# Patient Record
Sex: Male | Born: 1980
Health system: Southern US, Community
[De-identification: ages and names within clinical notes are randomized; demographics above are authoritative.]

## PROBLEM LIST (undated history)

## (undated) ENCOUNTER — Emergency Department (HOSPITAL_COMMUNITY): Admission: EM | Payer: Self-pay

## (undated) DIAGNOSIS — S56119A Strain of flexor muscle, fascia and tendon of finger of unspecified finger at forearm level, initial encounter: Secondary | ICD-10-CM

## (undated) HISTORY — PX: KNEE ARTHROSCOPY: SUR90

---

## 2018-09-30 ENCOUNTER — Other Ambulatory Visit: Payer: Self-pay

## 2018-09-30 ENCOUNTER — Ambulatory Visit (INDEPENDENT_AMBULATORY_CARE_PROVIDER_SITE_OTHER): Payer: 59 | Admitting: Emergency Medicine

## 2018-09-30 ENCOUNTER — Encounter: Payer: Self-pay | Admitting: Emergency Medicine

## 2018-09-30 VITALS — BP 106/68 | HR 59 | Temp 97.9°F | Resp 16 | Ht 70.0 in | Wt 264.4 lb

## 2018-09-30 DIAGNOSIS — J22 Unspecified acute lower respiratory infection: Secondary | ICD-10-CM | POA: Diagnosis not present

## 2018-09-30 DIAGNOSIS — R059 Cough, unspecified: Secondary | ICD-10-CM | POA: Insufficient documentation

## 2018-09-30 DIAGNOSIS — R05 Cough: Secondary | ICD-10-CM

## 2018-09-30 MED ORDER — AZITHROMYCIN 250 MG PO TABS: ORAL_TABLET | ORAL | 0 refills | Status: DC

## 2018-09-30 MED ORDER — BENZONATATE 200 MG PO CAPS: 200.0000 mg | ORAL_CAPSULE | Freq: Two times a day (BID) | ORAL | 0 refills | Status: DC | PRN

## 2018-09-30 NOTE — Progress Notes (Signed)
Ronald Hill 38 y.o.   Chief Complaint  Patient presents with  . Cough    productive with yellow mucus x 1 1/2 weeks  . Sore Throat    with left ear pain    HISTORY OF PRESENT ILLNESS: This is a 38 y.o. male complaining of productive cough for 1-1/[redacted] weeks along with sore throat and left ear pain.  Denies recent traveling.  No exposure to coronavirus.  No other significant symptoms.   HPI   Prior to Admission medications   Not on File    Allergies  Allergen Reactions  . Amoxicillin Other (See Comments)    With same symptoms as PCN  . Penicillins Other (See Comments)    Per patient when showered felt like body was burning and navel had pus coming out of it    There are no active problems to display for this patient.   No past medical history on file.    Social History   Socioeconomic History  . Marital status: Married    Spouse name: Not on file  . Number of children: Not on file  . Years of education: Not on file  . Highest education level: Not on file  Occupational History  . Not on file  Social Needs  . Financial resource strain: Not on file  . Food insecurity:    Worry: Not on file    Inability: Not on file  . Transportation needs:    Medical: Not on file    Non-medical: Not on file  Tobacco Use  . Smoking status: Current Every Day Smoker    Years: 14.00    Types: Cigarettes  . Smokeless tobacco: Never Used  Substance and Sexual Activity  . Alcohol use: Not on file    Comment: BEER OR SHOT OF LIQUOR  . Drug use: Yes    Types: Marijuana  . Sexual activity: Not on file  Lifestyle  . Physical activity:    Days per week: Not on file    Minutes per session: Not on file  . Stress: Not on file  Relationships  . Social connections:    Talks on phone: Not on file    Gets together: Not on file    Attends religious service: Not on file    Active member of club or organization: Not on file    Attends meetings of clubs or organizations: Not on file   Relationship status: Not on file  . Intimate partner violence:    Fear of current or ex partner: Not on file    Emotionally abused: Not on file    Physically abused: Not on file    Forced sexual activity: Not on file  Other Topics Concern  . Not on file  Social History Narrative  . Not on file    No family history on file.   Review of Systems  Constitutional: Negative.  Negative for chills and fever.  HENT: Negative.  Negative for sore throat.   Eyes: Negative.   Respiratory: Positive for cough and sputum production.   Cardiovascular: Negative.  Negative for chest pain and palpitations.  Gastrointestinal: Negative.  Negative for abdominal pain, diarrhea, nausea and vomiting.  Genitourinary: Negative.   Musculoskeletal: Negative.   Skin: Negative.  Negative for rash.  Neurological: Negative.  Negative for dizziness and headaches.  Endo/Heme/Allergies: Negative.   All other systems reviewed and are negative.  Vitals:   09/30/18 1601  BP: 106/68  Pulse: (!) 59  Resp: 16  Temp:  97.9 F (36.6 C)  SpO2: 98%     Physical Exam Vitals signs reviewed.  Constitutional:      Appearance: Normal appearance.  HENT:     Head: Normocephalic and atraumatic.     Nose: Nose normal.     Mouth/Throat:     Mouth: Mucous membranes are moist.     Pharynx: Oropharynx is clear.  Eyes:     Extraocular Movements: Extraocular movements intact.     Conjunctiva/sclera: Conjunctivae normal.     Pupils: Pupils are equal, round, and reactive to light.  Neck:     Musculoskeletal: Normal range of motion and neck supple.  Cardiovascular:     Rate and Rhythm: Normal rate and regular rhythm.     Heart sounds: Normal heart sounds.  Pulmonary:     Effort: Pulmonary effort is normal.     Breath sounds: Normal breath sounds.  Abdominal:     Palpations: Abdomen is soft.     Tenderness: There is no abdominal tenderness.  Musculoskeletal: Normal range of motion.  Skin:    General: Skin is warm  and dry.     Capillary Refill: Capillary refill takes less than 2 seconds.  Neurological:     General: No focal deficit present.     Mental Status: He is alert and oriented to person, place, and time.  Psychiatric:        Mood and Affect: Mood normal.        Behavior: Behavior normal.    A total of 30 minutes was spent in the room with the patient, greater than 50% of which was in counseling/coordination of care regarding diagnosis, treatment, medications, need to stay home for 2 weeks, ED precautions, prognosis, and need for follow-up if no better or worse.   ASSESSMENT & PLAN: Ronald Hill was seen today for cough and sore throat.  Diagnoses and all orders for this visit:  Cough -     benzonatate (TESSALON) 200 MG capsule; Take 1 capsule (200 mg total) by mouth 2 (two) times daily as needed for cough.  Lower respiratory infection -     azithromycin (ZITHROMAX) 250 MG tablet; Sig as indicated    Patient Instructions    Out of work note for 2 weeks.   If you have lab work done today you will be contacted with your lab results within the next 2 weeks.  If you have not heard from [place] then please contact us. The fastest way to get your results is to register for My Chart.   IF you received an x-ray today, you will receive an invoice from [facility]. Please contact [facility] at [phone] with questions or concerns regarding your invoice.   IF you received labwork today, you will receive an invoice from [facility]. Please contact [facility] at [phone] with questions or concerns regarding your invoice.   Our billing staff will not be able to assist you with questions regarding bills from these companies.  You will be contacted with the lab results as soon as they are available. The fastest way to get your results is to activate your My Chart account. Instructions are located on the last page of this paperwork. If you have not heard from [place] regarding the results in  2 weeks, please contact this office.     Cough, Adult  A cough helps to clear your throat and lungs. A cough may last only 2-3 weeks (acute), or it may last longer than 8 weeks (chronic). Many different things  can cause a cough. A cough may be a sign of an illness or another medical condition. Follow these instructions at home:  Pay attention to any changes in your cough.  Take medicines only as told by your doctor. ? If you were prescribed an antibiotic medicine, take it as told by your doctor. Do not stop taking it even if you start to feel better. ? Talk with your doctor before you try using a cough medicine.  Drink enough fluid to keep your pee (urine) clear or pale yellow.  If the air is dry, use a cold steam vaporizer or humidifier in your home.  Stay away from things that make you cough at work or at home.  If your cough is worse at night, try using extra pillows to raise your head up higher while you sleep.  Do not smoke, and try not to be around smoke. If you need help quitting, ask your doctor.  Do not have caffeine.  Do not drink alcohol.  Rest as needed. Contact a doctor if:  You have new problems (symptoms).  You cough up yellow fluid (pus).  Your cough does not get better after 2-3 weeks, or your cough gets worse.  Medicine does not help your cough and you are not sleeping well.  You have pain that gets worse or pain that is not helped with medicine.  You have a fever.  You are losing weight and you do not know why.  You have night sweats. Get help right away if:  You cough up blood.  You have trouble breathing.  Your heartbeat is very fast. This information is not intended to replace advice given to you by your health care provider. Make sure you discuss any questions you have with your health care provider. Document Released: 03/09/2011 Document Revised: 12/02/2015 Document Reviewed: 09/02/2014 Elsevier Interactive Patient Education  2019  Elsevier Inc.      Edwina BarthMiguel Jerika Wales, MD [facility]

## 2018-09-30 NOTE — Patient Instructions (Addendum)
  Out of work note for 2 weeks.   If you have lab work done today you will be contacted with your lab results within the next 2 weeks.  If you have not heard from [place] then please contact us. The fastest way to get your results is to register for My Chart.   IF you received an x-ray today, you will receive an invoice from [facility]. Please contact [facility] at [phone] with questions or concerns regarding your invoice.   IF you received labwork today, you will receive an invoice from [facility]. Please contact [facility] at [phone] with questions or concerns regarding your invoice.   Our billing staff will not be able to assist you with questions regarding bills from these companies.  You will be contacted with the lab results as soon as they are available. The fastest way to get your results is to activate your My Chart account. Instructions are located on the last page of this paperwork. If you have not heard from [place] regarding the results in 2 weeks, please contact this office.     Cough, Adult  A cough helps to clear your throat and lungs. A cough may last only 2-3 weeks (acute), or it may last longer than 8 weeks (chronic). Many different things can cause a cough. A cough may be a sign of an illness or another medical condition. Follow these instructions at home:  Pay attention to any changes in your cough.  Take medicines only as told by your doctor. ? If you were prescribed an antibiotic medicine, take it as told by your doctor. Do not stop taking it even if you start to feel better. ? Talk with your doctor before you try using a cough medicine.  Drink enough fluid to keep your pee (urine) clear or pale yellow.  If the air is dry, use a cold steam vaporizer or humidifier in your home.  Stay away from things that make you cough at work or at home.  If your cough is worse at night, try using extra pillows to raise your head up higher while you  sleep.  Do not smoke, and try not to be around smoke. If you need help quitting, ask your doctor.  Do not have caffeine.  Do not drink alcohol.  Rest as needed. Contact a doctor if:  You have new problems (symptoms).  You cough up yellow fluid (pus).  Your cough does not get better after 2-3 weeks, or your cough gets worse.  Medicine does not help your cough and you are not sleeping well.  You have pain that gets worse or pain that is not helped with medicine.  You have a fever.  You are losing weight and you do not know why.  You have night sweats. Get help right away if:  You cough up blood.  You have trouble breathing.  Your heartbeat is very fast. This information is not intended to replace advice given to you by your health care provider. Make sure you discuss any questions you have with your health care provider. Document Released: 03/09/2011 Document Revised: 12/02/2015 Document Reviewed: 09/02/2014 Elsevier Interactive Patient Education  2019 [employer].

## 2018-10-07 ENCOUNTER — Telehealth: Payer: Self-pay | Admitting: Emergency Medicine

## 2018-10-07 NOTE — Telephone Encounter (Signed)
Pt. Calling and asking if he had a corona virus test.states his employer wants to know. Also reports his pharmacy reports Kimberlee Nearing is on back order. Can something else for cough be sent to his pharmacy.

## 2018-10-08 NOTE — Telephone Encounter (Signed)
Called pt and informed him that he did not get tested for Covid-19 and that I would send the provider a message about a different medication for cough.

## 2018-10-09 NOTE — Telephone Encounter (Signed)
Spoke with pt about concerns and advised him of OTC medication for cough.

## 2018-10-09 NOTE — Telephone Encounter (Signed)
Mucinex-DM OTC. Thanks.

## 2018-11-13 ENCOUNTER — Telehealth: Payer: Self-pay | Admitting: Emergency Medicine

## 2018-11-13 ENCOUNTER — Ambulatory Visit: Payer: Self-pay | Admitting: *Deleted
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
## 2018-11-26 ENCOUNTER — Telehealth (INDEPENDENT_AMBULATORY_CARE_PROVIDER_SITE_OTHER): Payer: 59 | Admitting: Emergency Medicine

## 2018-11-26 ENCOUNTER — Other Ambulatory Visit: Payer: Self-pay

## 2018-11-26 DIAGNOSIS — F4323 Adjustment disorder with mixed anxiety and depressed mood: Secondary | ICD-10-CM

## 2018-11-26 MED ORDER — DULOXETINE HCL 30 MG PO CPEP: 30.0000 mg | ORAL_CAPSULE | Freq: Every day | ORAL | 3 refills | Status: DC
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
## 2018-11-26 NOTE — Progress Notes (Signed)
Called patient to triage for appointment. 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 Patient states when he has anxiety he sweats and heart rate increase.

## 2018-12-05 NOTE — Telephone Encounter (Signed)
APPNT COMPLETED  °

## 2018-12-18 NOTE — Telephone Encounter (Signed)
COULD NOT LEAVE VM

## 2019-02-10 ENCOUNTER — Telehealth: Payer: Self-pay | Admitting: Emergency Medicine

## 2019-02-10 NOTE — Telephone Encounter (Signed)
Pt called back requesting update on paperwork that needs to fax to his job for when he had [place]. Pt confirmed starting date is March 23.

## 2019-02-10 NOTE — Telephone Encounter (Signed)
Pt needs a 2 week out of work notice for when he tested positive for [place] in March 2020 faxed to [phone]

## 2019-02-11 ENCOUNTER — Telehealth: Payer: Self-pay | Admitting: *Deleted

## 2019-02-11 NOTE — Telephone Encounter (Signed)
Fax message in this patient's chart entered on 02/10/2019 was entered in an error. In this patient's chart he wanted a out of work note for 09/30/2018 office visit. Note was printed and given to the patient.

## 2019-02-11 NOTE — Telephone Encounter (Signed)
Faxed completed documents ATTN: LOA on 02/06/2019 and 02/10/2019, patient had an office visit with a change to paper work Part B #5 to be to 02/10/2019. Dr Mitchel Honour changed the date and paper work re faxed. Both times confirmation pages were received.

## 2019-02-24 ENCOUNTER — Telehealth: Payer: Self-pay

## 2019-02-24 NOTE — Telephone Encounter (Signed)
Pt came in to get letter and signature from [place] due to billing issue mix up from pt with the same name as he. Proof needed was faxed to [phone]. Copies of the forms faxed will be helld at nurses station.

## 2019-03-24 ENCOUNTER — Other Ambulatory Visit: Payer: Self-pay | Admitting: Family Medicine

## 2019-03-24 ENCOUNTER — Ambulatory Visit: Payer: Self-pay

## 2019-03-24 ENCOUNTER — Other Ambulatory Visit: Payer: Self-pay

## 2019-03-24 DIAGNOSIS — M79641 Pain in right hand: Secondary | ICD-10-CM

## 2019-05-05 ENCOUNTER — Other Ambulatory Visit: Payer: Self-pay

## 2019-05-05 ENCOUNTER — Ambulatory Visit (HOSPITAL_COMMUNITY)
Admission: EM | Admit: 2019-05-05 | Discharge: 2019-05-05 | Disposition: A | Discharge status: Home / Self Care | Payer: 59 | Attending: Family Medicine | Admitting: Family Medicine

## 2019-05-05 ENCOUNTER — Encounter (HOSPITAL_COMMUNITY): Payer: Self-pay

## 2019-05-05 DIAGNOSIS — R21 Rash and other nonspecific skin eruption: Secondary | ICD-10-CM

## 2019-05-05 MED ORDER — METHYLPREDNISOLONE ACETATE 40 MG/ML IJ SUSP
INTRAMUSCULAR | Status: AC
  Filled 2019-05-05: qty 1

## 2019-05-05 MED ORDER — METHYLPREDNISOLONE ACETATE 40 MG/ML IJ SUSP
40.0000 mg | Freq: Once | INTRAMUSCULAR | Status: AC
  Administered 2019-05-05: 19:00:00 40 mg via INTRAMUSCULAR

## 2019-05-05 NOTE — ED Triage Notes (Signed)
Pt states he has a rash all over this started 2 weeks ago. Pt states after taking the meds the rash got worst.

## 2019-05-05 NOTE — ED Provider Notes (Signed)
[facility]    CSN: [id number] Arrival date & time: 05/05/19  1726      History   Chief Complaint Chief Complaint  Patient presents with  . Rash    HPI Ronald Hill is a 38 y.o. male.   He is presenting with rash.  This is occurring on his abdomen as well as his chest.  He is having it on his neck and his left antecubital fossa.  He was seen last Friday and prescribed topical and oral medications.  He feels like his symptoms got progressively worse since that time.  He is having pain with this as well.  He denies any new or different foods.  He did stay in a hotel recently.  Denies any other form of travel.  No exposure to pets or animals.  Has used a different detergent.  Started taking fluconazole and ketoconazole feels like his symptoms have gotten worse.  Symptoms becoming more constant and severe.  HPI  History reviewed. No pertinent past medical history.  There are no active problems to display for this patient.   History reviewed. No pertinent surgical history.     Home Medications    Prior to Admission medications   Medication Sig Start Date End Date Taking? Authorizing Provider  DULoxetine (CYMBALTA) 30 MG capsule Take 1 capsule (30 mg total) by mouth daily for 30 days. 11/26/18 12/26/18  Georgina Quint, MD    Family History History reviewed. No pertinent family history.  Social History Social History   Tobacco Use  . Smoking status: Current Every Day Smoker    Years: 14.00    Types: Cigarettes  . Smokeless tobacco: Never Used  Substance Use Topics  . Alcohol use: Yes    Comment: BEER OR SHOT OF LIQUOR  . Drug use: Yes    Types: Marijuana     Allergies   Shellfish allergy, Amoxicillin, and Penicillins   Review of Systems Review of Systems  Constitutional: Negative for fever.  HENT: Negative for congestion.   Respiratory: Negative for cough.   Cardiovascular: Negative for chest pain.  Gastrointestinal: Negative for abdominal  pain.  Musculoskeletal: Negative for gait problem.  Skin: Positive for rash.  Neurological: Negative for weakness.  Hematological: Negative for adenopathy.     Physical Exam Triage Vital Signs ED Triage Vitals  Enc Vitals Group     BP 05/05/19 1750 (!) 141/83     Pulse Rate 05/05/19 1750 72     Resp 05/05/19 1750 18     Temp 05/05/19 1750 98.6 F (37 C)     Temp Source 05/05/19 1750 Oral     SpO2 05/05/19 1750 100 %     Weight 05/05/19 1755 259 lb (117.5 kg)     Height --      Head Circumference --      Peak Flow --      Pain Score 05/05/19 1754 6     Pain Loc --      Pain Edu? --      Excl. in GC? --    No data found.  Updated Vital Signs BP (!) 141/83 (BP Location: Right Arm)   Pulse 72   Temp 98.6 F (37 C) (Oral)   Resp 18   Wt 117.5 kg   SpO2 100%   BMI 37.16 kg/m   Visual Acuity Right Eye Distance:   Left Eye Distance:   Bilateral Distance:    Right Eye Near:   Left Eye Near:  Bilateral Near:     Physical Exam Gen: NAD, alert, cooperative with exam, well-appearing ENT: normal lips, normal nasal mucosa,  Eye: normal EOM, normal conjunctiva and lids CV:  no edema, +2 pedal pulses   Resp: no accessory muscle use, non-labored,  Skin: Scaly patches with central clearing and circular formation in different areas throughout the trunk and upper extremity and neck, weeping to the area under the left and right breast, Neuro: normal tone, normal sensation to touch Psych:  normal insight, alert and oriented MSK: normal gait, normal strength              UC Treatments / Results  Labs (all labs ordered are listed, but only abnormal results are displayed) Labs Reviewed - No data to display  EKG   Radiology No results found.  Procedures Procedures (including critical care time)  Medications Ordered in UC Medications  methylPREDNISolone acetate (DEPO-MEDROL) injection 40 mg (40 mg Intramuscular Given 05/05/19 1859)  methylPREDNISolone  acetate (DEPO-MEDROL) 40 MG/ML injection (has no administration in time range)    Initial Impression / Assessment and Plan / UC Course  I have reviewed the triage vital signs and the nursing notes.  Pertinent labs & imaging results that were available during my care of the patient were reviewed by me and considered in my medical decision making (see chart for details).     Mr. Ledo is a 38 year old male that is presenting with rash.  Having varying degrees of lesions with no improvement with ketoconazole and fluconazole.  Seems severely irritated at this time.  Unsure if underlying bacterial infection on top of the rash.  IM Depo-Medrol.  Counseled on supportive care.  Advise close follow-up and may need antibiotics as well.  Final Clinical Impressions(s) / UC Diagnoses   Final diagnoses:  Rash     Discharge Instructions     Please follow up in 3-4 days Please hold the medications that you were provided before today  Please try ibuprofen for pain  Please try to wear loose fitting clothing      ED Prescriptions    None     PDMP not reviewed this encounter.   Rosemarie Ax, MD 05/05/19 2119

## 2019-05-05 NOTE — Discharge Instructions (Addendum)
Please follow up in 3-4 days Please hold the medications that you were provided before today  Please try ibuprofen for pain  Please try to wear loose fitting clothing

## 2019-05-09 ENCOUNTER — Ambulatory Visit (HOSPITAL_COMMUNITY)
Admission: EM | Admit: 2019-05-09 | Discharge: 2019-05-09 | Disposition: A | Discharge status: Home / Self Care | Payer: 59 | Attending: Family Medicine | Admitting: Family Medicine

## 2019-05-09 ENCOUNTER — Other Ambulatory Visit: Payer: Self-pay

## 2019-05-09 ENCOUNTER — Encounter (HOSPITAL_COMMUNITY): Payer: Self-pay | Admitting: Family Medicine

## 2019-05-09 DIAGNOSIS — T7840XD Allergy, unspecified, subsequent encounter: Secondary | ICD-10-CM | POA: Diagnosis not present

## 2019-05-09 MED ORDER — METHYLPREDNISOLONE 4 MG PO TABS: 4.0000 mg | ORAL_TABLET | Freq: Two times a day (BID) | ORAL | 1 refills | Status: DC

## 2019-05-09 NOTE — ED Provider Notes (Addendum)
[facility]    CSN: [id number] Arrival date & time: 05/09/19  1221      History   Chief Complaint Chief Complaint  Patient presents with  . Follow-up  . Rash    HPI Ronald Hill is a 38 y.o. male.   Established patient at [facility] seen just 4 days ago, here for f/u of rash treated with steroids at that visit   His rash is much better today.   Note from 05/05/2019:  Ronald Hill is a 38 year old male that is presenting with rash.  Having varying degrees of lesions with no improvement with ketoconazole and fluconazole.  Seems severely irritated at this time.  Unsure if underlying bacterial infection on top of the rash.  IM Depo-Medrol.  Counseled on supportive care.  Advise close follow-up and may need antibiotics as well.     History reviewed. No pertinent past medical history.  There are no active problems to display for this patient.   History reviewed. No pertinent surgical history.     Home Medications    Prior to Admission medications   Medication Sig Start Date End Date Taking? Authorizing Provider  DULoxetine (CYMBALTA) 30 MG capsule Take 1 capsule (30 mg total) by mouth daily for 30 days. 11/26/18 12/26/18  Horald Pollen, MD  methylPREDNISolone (MEDROL) 4 MG tablet Take 1 tablet (4 mg total) by mouth 2 (two) times daily. 05/09/19   Robyn Haber, MD    Family History History reviewed. No pertinent family history.  Social History Social History   Tobacco Use  . Smoking status: Current Every Day Smoker    Years: 14.00    Types: Cigarettes  . Smokeless tobacco: Never Used  Substance Use Topics  . Alcohol use: Yes    Comment: BEER OR SHOT OF LIQUOR  . Drug use: Yes    Types: Marijuana     Allergies   Shellfish allergy, Amoxicillin, and Penicillins   Review of Systems Review of Systems  Skin: Positive for rash.  All other systems reviewed and are negative.    Physical Exam Triage Vital Signs ED Triage Vitals  Enc Vitals  Group     BP      Pulse      Resp      Temp      Temp src      SpO2      Weight      Height      Head Circumference      Peak Flow      Pain Score      Pain Loc      Pain Edu?      Excl. in [place]?    No data found.  Updated Vital Signs BP (!) 149/93 (BP Location: Left Arm)   Pulse 61   Temp 98.2 F (36.8 C) (Oral)   SpO2 100%   Physical Exam Vitals signs and nursing note reviewed.  Constitutional:      Appearance: Normal appearance. He is obese.  Eyes:     Conjunctiva/sclera: Conjunctivae normal.  Neck:     Musculoskeletal: Normal range of motion and neck supple.  Cardiovascular:     Rate and Rhythm: Normal rate.  Musculoskeletal: Normal range of motion.  Skin:    General: Skin is warm and dry.     Comments: The areas of thickening and oozing have cleared leaving pink and hyperpigmented patches on antecubital areas, umbilicus, and inframammary areas.  Neurological:     General: No focal  deficit present.     Mental Status: He is alert and oriented to person, place, and time.  Psychiatric:        Mood and Affect: Mood normal.      UC Treatments / Results  Labs (all labs ordered are listed, but only abnormal results are displayed) Labs Reviewed - No data to display  EKG   Radiology No results found.  Procedures Procedures (including critical care time)  Medications Ordered in UC Medications - No data to display  Initial Impression / Assessment and Plan / UC Course  I have reviewed the triage vital signs and the nursing notes.  Pertinent labs & imaging results that were available during my care of the patient were reviewed by me and considered in my medical decision making (see chart for details).    Final Clinical Impressions(s) / UC Diagnoses   Final diagnoses:  Allergic reaction, subsequent encounter     Discharge Instructions     You appear to have had an allergic reaction to the original medicine you were given    ED Prescriptions     Medication Sig Dispense Auth. Provider   methylPREDNISolone (MEDROL) 4 MG tablet Take 1 tablet (4 mg total) by mouth 2 (two) times daily. 10 tablet Elvina Sidle, MD     I have reviewed the PDMP during this encounter.   Elvina Sidle, MD 05/09/19 1315    Elvina Sidle, MD 05/09/19 1319

## 2019-05-09 NOTE — ED Triage Notes (Signed)
Pt presents to the UC for follow up with the rash his has in his body x 2 weeks. Pt states the rash is improving since Monday after Depo-Medrol injection, but he states he still has the itchy sensation.

## 2019-05-09 NOTE — Discharge Instructions (Addendum)
You appear to have had an allergic reaction to the original medicine you were given

## 2019-10-25 ENCOUNTER — Encounter (HOSPITAL_COMMUNITY): Payer: Self-pay | Admitting: Emergency Medicine

## 2019-10-25 ENCOUNTER — Encounter (HOSPITAL_COMMUNITY): Payer: Self-pay

## 2019-10-25 ENCOUNTER — Ambulatory Visit (HOSPITAL_COMMUNITY)
Admission: EM | Admit: 2019-10-25 | Discharge: 2019-10-25 | Disposition: A | Discharge status: Home / Self Care | Payer: Self-pay | Attending: Emergency Medicine | Admitting: Emergency Medicine

## 2019-10-25 ENCOUNTER — Ambulatory Visit (INDEPENDENT_AMBULATORY_CARE_PROVIDER_SITE_OTHER): Payer: Self-pay

## 2019-10-25 ENCOUNTER — Emergency Department (HOSPITAL_COMMUNITY)
Admission: EM | Admit: 2019-10-25 | Discharge: 2019-10-25 | Disposition: A | Discharge status: Home / Self Care | Payer: 59 | Attending: Emergency Medicine | Admitting: Emergency Medicine

## 2019-10-25 ENCOUNTER — Other Ambulatory Visit: Payer: Self-pay

## 2019-10-25 DIAGNOSIS — L03011 Cellulitis of right finger: Secondary | ICD-10-CM

## 2019-10-25 DIAGNOSIS — M549 Dorsalgia, unspecified: Secondary | ICD-10-CM | POA: Insufficient documentation

## 2019-10-25 DIAGNOSIS — Z5321 Procedure and treatment not carried out due to patient leaving prior to being seen by health care provider: Secondary | ICD-10-CM | POA: Insufficient documentation

## 2019-10-25 LAB — URINALYSIS, ROUTINE W REFLEX MICROSCOPIC
Bilirubin Urine: NEGATIVE
Glucose, UA: NEGATIVE mg/dL
Hgb urine dipstick: NEGATIVE
Ketones, ur: NEGATIVE mg/dL
Leukocytes,Ua: NEGATIVE
Nitrite: NEGATIVE
Protein, ur: NEGATIVE mg/dL
Specific Gravity, Urine: 1.02 (ref 1.005–1.030)
pH: 6 (ref 5.0–8.0)

## 2019-10-25 LAB — COMPREHENSIVE METABOLIC PANEL
ALT: 36 U/L (ref 0–44)
AST: 32 U/L (ref 15–41)
Albumin: 3.6 g/dL (ref 3.5–5.0)
Alkaline Phosphatase: 58 U/L (ref 38–126)
Anion gap: 10 (ref 5–15)
BUN: 19 mg/dL (ref 6–20)
CO2: 26 mmol/L (ref 22–32)
Calcium: 8.9 mg/dL (ref 8.9–10.3)
Chloride: 104 mmol/L (ref 98–111)
Creatinine, Ser: 1.06 mg/dL (ref 0.61–1.24)
GFR calc Af Amer: >60 mL/min (ref >60)
GFR calc non Af Amer: >60 mL/min (ref >60)
Glucose, Bld: 124 mg/dL — ABNORMAL HIGH (ref 70–99)
Potassium: 3.9 mmol/L (ref 3.5–5.1)
Sodium: 140 mmol/L (ref 135–145)
Total Bilirubin: 0.5 mg/dL (ref 0.3–1.2)
Total Protein: 7.1 g/dL (ref 6.5–8.1)

## 2019-10-25 LAB — CBC WITH DIFFERENTIAL/PLATELET
Abs Immature Granulocytes: 0.02 10*3/uL (ref 0.00–0.07)
Basophils Absolute: 0 10*3/uL (ref 0.0–0.1)
Basophils Relative: 0 %
Eosinophils Absolute: 0.1 10*3/uL (ref 0.0–0.5)
Eosinophils Relative: 2 %
HCT: 45.1 % (ref 39.0–52.0)
Hemoglobin: 14.4 g/dL (ref 13.0–17.0)
Immature Granulocytes: 0 %
Lymphocytes Relative: 24 %
Lymphs Abs: 1.8 10*3/uL (ref 0.7–4.0)
MCH: 27.4 pg (ref 26.0–34.0)
MCHC: 31.9 g/dL (ref 30.0–36.0)
MCV: 85.7 fL (ref 80.0–100.0)
Monocytes Absolute: 0.6 10*3/uL (ref 0.1–1.0)
Monocytes Relative: 7 %
Neutro Abs: 5.1 10*3/uL (ref 1.7–7.7)
Neutrophils Relative %: 67 %
Platelets: 341 10*3/uL (ref 150–400)
RBC: 5.26 MIL/uL (ref 4.22–5.81)
RDW: 14.4 % (ref 11.5–15.5)
WBC: 7.7 10*3/uL (ref 4.0–10.5)
nRBC: 0 % (ref 0.0–0.2)

## 2019-10-25 MED ORDER — CLINDAMYCIN HCL 300 MG PO CAPS: 300.0000 mg | ORAL_CAPSULE | Freq: Three times a day (TID) | ORAL | 0 refills | Status: AC

## 2019-10-25 MED ORDER — IBUPROFEN 800 MG PO TABS: 800.0000 mg | ORAL_TABLET | Freq: Three times a day (TID) | ORAL | 0 refills | Status: DC

## 2019-10-25 MED ORDER — HYDROCODONE-ACETAMINOPHEN 5-325 MG PO TABS: 1.0000 | ORAL_TABLET | Freq: Four times a day (QID) | ORAL | 0 refills | Status: DC | PRN

## 2019-10-25 NOTE — Discharge Instructions (Signed)
Begin clindamycin 3 times daily for the next week Use anti-inflammatories for pain/swelling. You may take up to 800 mg Ibuprofen every 8 hours with food. You may supplement Ibuprofen with Tylenol [phone] mg every 8 hours.  Hydrocodone for severe pain  If symptoms/pain progressing or worsening, developing increased area of pus to tip of finger please follow-up in the emergency room

## 2019-10-25 NOTE — ED Notes (Signed)
Pt LMA

## 2019-10-25 NOTE — ED Triage Notes (Signed)
Pt c/o pain right 3rd finger for past three days. Pt states that pain/swelling originated along nail perimeter. Pt reports that he has been "cutting" along perimeter/edge of finger in effort to drain infection/relieve pressure. Pt reports small amount of yellow drainage after incising area.  Finger notably edematous and erythemic.

## 2019-10-25 NOTE — ED Provider Notes (Signed)
[facility]    CSN: [id number] Arrival date & time: 10/25/19  1459      History   Chief Complaint Chief Complaint  Patient presents with  . Finger Injury    HPI Ronald Hill is a 39 y.o. male no significant past medical history presenting today for evaluation of finger pain and infection.  Patient notes that over the past 3 days he has developed increased pain and swelling around the nailbed of his right third finger.  Pain has extended into the distal finger pad.  He denies any fevers, but does report throbbing sensation in finger.  Denies any injury or trauma.   HPI  History reviewed. No pertinent past medical history.  There are no problems to display for this patient.   Past Surgical History:  Procedure Laterality Date  . KNEE ARTHROSCOPY         Home Medications    Prior to Admission medications   Medication Sig Start Date End Date Taking? Authorizing Provider  clindamycin (CLEOCIN) 300 MG capsule Take 1 capsule (300 mg total) by mouth 3 (three) times daily for 7 days. 10/25/19 11/01/19  Britten Parady C, PA-C  DULoxetine (CYMBALTA) 30 MG capsule Take 1 capsule (30 mg total) by mouth daily for 30 days. 11/26/18 12/26/18  Georgina Quint, MD  HYDROcodone-acetaminophen (NORCO/VICODIN) 5-325 MG tablet Take 1-2 tablets by mouth every 6 (six) hours as needed for severe pain. 10/25/19   Noe Pittsley C, PA-C  ibuprofen (ADVIL) 800 MG tablet Take 1 tablet (800 mg total) by mouth 3 (three) times daily. 10/25/19   Omario Ander, Junius Creamer, PA-C    Family History History reviewed. No pertinent family history.  Social History Social History   Tobacco Use  . Smoking status: Current Every Day Smoker    Years: 14.00    Types: Cigarettes  . Smokeless tobacco: Never Used  Substance Use Topics  . Alcohol use: Yes    Comment: BEER OR SHOT OF LIQUOR  . Drug use: Yes    Types: Marijuana     Allergies   Shellfish allergy, Amoxicillin, and Penicillins   Review  of Systems Review of Systems  Constitutional: Negative for fatigue and fever.  Eyes: Negative for redness, itching and visual disturbance.  Respiratory: Negative for shortness of breath.   Cardiovascular: Negative for chest pain and leg swelling.  Gastrointestinal: Negative for nausea and vomiting.  Musculoskeletal: Positive for arthralgias and joint swelling. Negative for myalgias.  Skin: Positive for color change. Negative for rash and wound.  Neurological: Negative for dizziness, syncope, weakness, light-headedness and headaches.     Physical Exam Triage Vital Signs ED Triage Vitals  Enc Vitals Group     BP 10/25/19 1544 113/72     Pulse Rate 10/25/19 1544 77     Resp 10/25/19 1544 18     Temp 10/25/19 1544 98.2 F (36.8 C)     Temp Source 10/25/19 1544 Oral     SpO2 10/25/19 1544 100 %     Weight --      Height --      Head Circumference --      Peak Flow --      Pain Score 10/25/19 1541 10     Pain Loc --      Pain Edu? --      Excl. in GC? --    No data found.  Updated Vital Signs BP 113/72 (BP Location: Left Arm)   Pulse 77   Temp 98.2  F (36.8 C) (Oral)   Resp 18   SpO2 100%   Visual Acuity Right Eye Distance:   Left Eye Distance:   Bilateral Distance:    Right Eye Near:   Left Eye Near:    Bilateral Near:     Physical Exam Vitals and nursing note reviewed.  Constitutional:      Appearance: He is well-developed.     Comments: No acute distress  HENT:     Head: Normocephalic and atraumatic.     Nose: Nose normal.  Eyes:     Conjunctiva/sclera: Conjunctivae normal.  Cardiovascular:     Rate and Rhythm: Normal rate.  Pulmonary:     Effort: Pulmonary effort is normal. No respiratory distress.  Abdominal:     General: There is no distension.  Musculoskeletal:        General: Normal range of motion.     Cervical back: Neck supple.  Skin:    General: Skin is warm and dry.     Comments: Right middle finger with erythema and swelling to lateral  nail fold, tender to palpation in this area, extending to distal finger pulp, slight hyperpigmentation with faint white patchiness noted to distal finger pad, no obvious pus pocket, discoloration and swelling does not extend more proximally  Neurological:     Mental Status: He is alert and oriented to person, place, and time.      UC Treatments / Results  Labs (all labs ordered are listed, but only abnormal results are displayed) Labs Reviewed - No data to display  EKG   Radiology DG Finger Middle Right  Result Date: 10/25/2019 CLINICAL DATA:  Right middle finger swelling without known injury. EXAM: RIGHT MIDDLE FINGER 2+V COMPARISON:  March 24, 2019. FINDINGS: There is no evidence of fracture or dislocation. There is no evidence of arthropathy or other focal bone abnormality. Soft tissues are unremarkable. No lytic destruction is seen to suggest osteomyelitis. IMPRESSION: Negative. Electronically Signed   By: Lupita Raider M.D.   On: 10/25/2019 16:18    Procedures Incision and Drainage  Date/Time: 10/25/2019 6:13 PM Performed by: Jinny Sweetland, [place] C, PA-C Authorized by: Jerriann Schrom, Junius Creamer, PA-C   Consent:    Consent obtained:  Verbal   Consent given by:  Patient and parent   Risks discussed:  Incomplete drainage, pain and bleeding   Alternatives discussed:  Alternative treatment Location:    Type:  Abscess (Paronychia)   Location:  Upper extremity   Upper extremity location:  Finger   Finger location:  R long finger Pre-procedure details:    Skin preparation:  Betadine Anesthesia (see MAR for exact dosages):    Anesthesia method:  None Procedure type:    Complexity:  Simple Procedure details:    Incision types:  Single straight   Scalpel blade:  11   Drainage:  Purulent and bloody   Drainage amount:  Scant   Wound treatment:  Wound left open Post-procedure details:    Patient tolerance of procedure:  Tolerated well, no immediate complications   (including  critical care time)  Medications Ordered in UC Medications - No data to display  Initial Impression / Assessment and Plan / UC Course  I have reviewed the triage vital signs and the nursing notes.  Pertinent labs & imaging results that were available during my care of the patient were reviewed by me and considered in my medical decision making (see chart for details).     Exam suggestive of paronychia, possible early  felon.  Discussed concerns of felon and evaluation in emergency room.  Patient had waited 5 hours in emergency room prior to being seen at the urgent care and does not wish to return.  X-ray negative for osteomyelitis.  Will opt for oral antibiotic trial with clindamycin, pain management with anti-inflammatories for mild to moderate pain, hydrocodone for severe pain.  Registry checked.  Discussed drowsiness and risks associated with hydrocodone.  Advised if any symptoms progressing or worsening over the next 24 to 48 hours to be seen in the emergency room.  Discussed strict return precautions. Patient verbalized understanding and is agreeable with plan.  Final Clinical Impressions(s) / UC Diagnoses   Final diagnoses:  Paronychia of finger, right  Felon of finger of right hand     Discharge Instructions     Begin clindamycin 3 times daily for the next week Use anti-inflammatories for pain/swelling. You may take up to 800 mg Ibuprofen every 8 hours with food. You may supplement Ibuprofen with Tylenol [phone] mg every 8 hours.  Hydrocodone for severe pain  If symptoms/pain progressing or worsening, developing increased area of pus to tip of finger please follow-up in the emergency room   ED Prescriptions    Medication Sig Dispense Auth. Provider   clindamycin (CLEOCIN) 300 MG capsule Take 1 capsule (300 mg total) by mouth 3 (three) times daily for 7 days. 28 capsule Adrine Hayworth C, PA-C   ibuprofen (ADVIL) 800 MG tablet Take 1 tablet (800 mg total) by mouth 3 (three)  times daily. 21 tablet Markeria Goetsch C, PA-C   HYDROcodone-acetaminophen (NORCO/VICODIN) 5-325 MG tablet Take 1-2 tablets by mouth every 6 (six) hours as needed for severe pain. 10 tablet Jelissa Espiritu, [place] C, PA-C     I have reviewed the PDMP during this encounter.   Janith Lima, [place] 10/25/19 1814

## 2019-10-25 NOTE — ED Triage Notes (Signed)
Pt c/o left lower back pain since yesterday radiating to his legs, denies any urinary symptoms.

## 2019-11-04 ENCOUNTER — Emergency Department (HOSPITAL_COMMUNITY)
Admission: EM | Admit: 2019-11-04 | Discharge: 2019-11-05 | Disposition: A | Discharge status: Home / Self Care | Payer: Self-pay | Attending: Emergency Medicine | Admitting: Emergency Medicine

## 2019-11-04 ENCOUNTER — Encounter (HOSPITAL_COMMUNITY): Payer: Self-pay | Admitting: Emergency Medicine

## 2019-11-04 ENCOUNTER — Other Ambulatory Visit: Payer: Self-pay

## 2019-11-04 DIAGNOSIS — S39012A Strain of muscle, fascia and tendon of lower back, initial encounter: Secondary | ICD-10-CM | POA: Insufficient documentation

## 2019-11-04 DIAGNOSIS — Y9389 Activity, other specified: Secondary | ICD-10-CM | POA: Insufficient documentation

## 2019-11-04 DIAGNOSIS — F17210 Nicotine dependence, cigarettes, uncomplicated: Secondary | ICD-10-CM | POA: Insufficient documentation

## 2019-11-04 DIAGNOSIS — W109XXA Fall (on) (from) unspecified stairs and steps, initial encounter: Secondary | ICD-10-CM | POA: Insufficient documentation

## 2019-11-04 DIAGNOSIS — Y999 Unspecified external cause status: Secondary | ICD-10-CM | POA: Insufficient documentation

## 2019-11-04 DIAGNOSIS — Y92019 Unspecified place in single-family (private) house as the place of occurrence of the external cause: Secondary | ICD-10-CM | POA: Insufficient documentation

## 2019-11-04 NOTE — ED Triage Notes (Signed)
Pt st's he fell down steps at his house last pm   Pt c/o lower back pain

## 2019-11-05 ENCOUNTER — Emergency Department (HOSPITAL_COMMUNITY): Payer: Self-pay

## 2019-11-05 MED ORDER — ONDANSETRON 4 MG PO TBDP
8.0000 mg | ORAL_TABLET | Freq: Once | ORAL | Status: AC
  Administered 2019-11-05: 8 mg via ORAL
  Filled 2019-11-05: qty 2

## 2019-11-05 MED ORDER — METHOCARBAMOL 500 MG PO TABS: 500.0000 mg | ORAL_TABLET | Freq: Three times a day (TID) | ORAL | 0 refills | Status: DC | PRN

## 2019-11-05 MED ORDER — IBUPROFEN 800 MG PO TABS: 800.0000 mg | ORAL_TABLET | Freq: Three times a day (TID) | ORAL | 0 refills | Status: DC

## 2019-11-05 MED ORDER — HYDROMORPHONE HCL 1 MG/ML IJ SOLN
1.0000 mg | Freq: Once | INTRAMUSCULAR | Status: AC
  Administered 2019-11-05: 01:00:00 1 mg via INTRAMUSCULAR
  Filled 2019-11-05: qty 1

## 2019-11-05 NOTE — ED Notes (Signed)
Pt verbalized understanding of discharge instructions. Follow up care and prescriptions reviewed, pt had no further questions. 

## 2019-11-05 NOTE — ED Provider Notes (Signed)
[facility] EMERGENCY DEPARTMENT Provider Note   CSN: [id number] Arrival date & time: 11/04/19  2245     History Chief Complaint  Patient presents with  . Fall    Ronald Hill is a 39 y.o. male.  Patient presents to the ER for evaluation of low back pain.  Patient reports that he fell downstairs last night.  Patient has been having low back pain that does not radiate to the legs since the fall.  He has been trying to rest and use ibuprofen today but pain has been moderate to severe.  No change in bowel or bladder function.  No numbness, tingling or weakness of lower extremities.  No other injury.        History reviewed. No pertinent past medical history.  There are no problems to display for this patient.   Past Surgical History:  Procedure Laterality Date  . KNEE ARTHROSCOPY         No family history on file.  Social History   Tobacco Use  . Smoking status: Current Every Day Smoker    Years: 14.00    Types: Cigarettes  . Smokeless tobacco: Never Used  Substance Use Topics  . Alcohol use: Yes    Comment: BEER OR SHOT OF LIQUOR  . Drug use: Yes    Types: Marijuana    Home Medications Prior to Admission medications   Medication Sig Start Date End Date Taking? Authorizing Provider  DULoxetine (CYMBALTA) 30 MG capsule Take 1 capsule (30 mg total) by mouth daily for 30 days. 11/26/18 12/26/18  Horald Pollen, MD  HYDROcodone-acetaminophen (NORCO/VICODIN) 5-325 MG tablet Take 1-2 tablets by mouth every 6 (six) hours as needed for severe pain. 10/25/19   Wieters, Hallie C, PA-C  ibuprofen (ADVIL) 800 MG tablet Take 1 tablet (800 mg total) by mouth 3 (three) times daily. 11/05/19   Orpah Greek, MD  methocarbamol (ROBAXIN) 500 MG tablet Take 1 tablet (500 mg total) by mouth every 8 (eight) hours as needed for muscle spasms. 11/05/19   Orpah Greek, MD    Allergies    Shellfish allergy, Amoxicillin, and Penicillins  Review  of Systems   Review of Systems  Musculoskeletal: Positive for back pain.  All other systems reviewed and are negative.   Physical Exam Updated Vital Signs BP 115/77 (BP Location: Left Arm)   Pulse 98   Temp 98.1 F (36.7 C) (Oral)   Resp 18   Ht 5\' 9"  (1.753 m)   Wt 117.5 kg   SpO2 96%   BMI 38.25 kg/m   Physical Exam Vitals and nursing note reviewed.  Constitutional:      General: He is not in acute distress.    Appearance: Normal appearance. He is well-developed.  HENT:     Head: Normocephalic and atraumatic.     Right Ear: Hearing normal.     Left Ear: Hearing normal.     Nose: Nose normal.  Eyes:     Conjunctiva/sclera: Conjunctivae normal.     Pupils: Pupils are equal, round, and reactive to light.  Cardiovascular:     Rate and Rhythm: Regular rhythm.     Heart sounds: S1 normal and S2 normal. No murmur. No friction rub. No gallop.   Pulmonary:     Effort: Pulmonary effort is normal. No respiratory distress.     Breath sounds: Normal breath sounds.  Chest:     Chest wall: No tenderness.  Abdominal:     General:  Bowel sounds are normal.     Palpations: Abdomen is soft.     Tenderness: There is no abdominal tenderness. There is no guarding or rebound. Negative signs include Murphy's sign and McBurney's sign.     Hernia: No hernia is present.  Musculoskeletal:        General: Normal range of motion.     Cervical back: Normal range of motion and neck supple.  Skin:    General: Skin is warm and dry.     Findings: No rash.  Neurological:     Mental Status: He is alert and oriented to person, place, and time.     GCS: GCS eye subscore is 4. GCS verbal subscore is 5. GCS motor subscore is 6.     Cranial Nerves: No cranial nerve deficit.     Sensory: No sensory deficit.     Coordination: Coordination normal.     Deep Tendon Reflexes:     Reflex Scores:      Patellar reflexes are 2+ on the right side and 2+ on the left side.    Comments: Normal strength with  bilateral leg raises, flexion extension at knee and ankle  No foot drop  Normal sensation, no saddle anesthesia  Psychiatric:        Speech: Speech normal.        Behavior: Behavior normal.        Thought Content: Thought content normal.     ED Results / Procedures / Treatments   Labs (all labs ordered are listed, but only abnormal results are displayed) Labs Reviewed - No data to display  EKG None  Radiology DG Lumbar Spine Complete  Result Date: 11/05/2019 CLINICAL DATA:  Status post trauma. EXAM: LUMBAR SPINE - COMPLETE 4+ VIEW COMPARISON:  None. FINDINGS: There is no evidence of an acute lumbar spine fracture. 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 A chronic-appearing radiopaque area is also seen along the lateral aspect of the L5 vertebral body on the right. This is best seen as a well-defined radiopaque area overlying the lower L5 vertebral body and L5-S1 intervertebral disc space on the lateral view. Alignment is normal. Mild intervertebral disc space narrowing is seen at the level of L5-S1. IMPRESSION: 1. No evidence of acute lumbar spine fracture. Electronically Signed   By: Aram Candela M.D.   On: 11/05/2019 00:52    Procedures Procedures (including critical care time)  Medications Ordered in ED Medications  HYDROmorphone (DILAUDID) injection 1 mg (1 mg Intramuscular Given 11/05/19 0107)  ondansetron (ZOFRAN-ODT) disintegrating tablet 8 mg (8 mg Oral Given 11/05/19 0107)    ED Course  I have reviewed the triage vital signs and the nursing notes.  Pertinent labs & imaging results that were available during my care of the patient were reviewed by me and considered in my medical decision making (see chart for details).    MDM Rules/Calculators/A&P                      Patient presents to the emergency department for evaluation of low back pain after a fall which occurred yesterday.  Examination  reveals diffuse tenderness in the lumbar region.  No step-offs or defects on exam.  He does not have radiculopathy and neurologic exam is normal.  No foot drop, no saddle anesthesia.  No change in bowel or bladder function.  X-ray does not show any vertebral injury.  Patient  treated with analgesia and will be discharged with rest and analgesia.  Final Clinical Impression(s) / ED Diagnoses Final diagnoses:  Lumbar strain, initial encounter    Rx / DC Orders ED Discharge Orders         Ordered    ibuprofen (ADVIL) 800 MG tablet  3 times daily     11/05/19 0137    methocarbamol (ROBAXIN) 500 MG tablet  Every 8 hours PRN     11/05/19 0137           Gilda Crease, MD 11/05/19 [phone]

## 2020-02-17 ENCOUNTER — Emergency Department (HOSPITAL_COMMUNITY)
Admission: EM | Admit: 2020-02-17 | Discharge: 2020-02-17 | Disposition: A | Discharge status: Home / Self Care | Payer: No Typology Code available for payment source | Attending: Emergency Medicine | Admitting: Emergency Medicine

## 2020-02-17 ENCOUNTER — Emergency Department (HOSPITAL_COMMUNITY): Payer: No Typology Code available for payment source

## 2020-02-17 ENCOUNTER — Other Ambulatory Visit: Payer: Self-pay

## 2020-02-17 DIAGNOSIS — S8002XA Contusion of left knee, initial encounter: Secondary | ICD-10-CM | POA: Diagnosis not present

## 2020-02-17 DIAGNOSIS — Y999 Unspecified external cause status: Secondary | ICD-10-CM | POA: Diagnosis not present

## 2020-02-17 DIAGNOSIS — S61412A Laceration without foreign body of left hand, initial encounter: Secondary | ICD-10-CM | POA: Diagnosis not present

## 2020-02-17 DIAGNOSIS — Y9389 Activity, other specified: Secondary | ICD-10-CM | POA: Diagnosis not present

## 2020-02-17 DIAGNOSIS — Y92410 Unspecified street and highway as the place of occurrence of the external cause: Secondary | ICD-10-CM | POA: Diagnosis not present

## 2020-02-17 DIAGNOSIS — R109 Unspecified abdominal pain: Secondary | ICD-10-CM | POA: Diagnosis not present

## 2020-02-17 DIAGNOSIS — S6992XA Unspecified injury of left wrist, hand and finger(s), initial encounter: Secondary | ICD-10-CM | POA: Diagnosis present

## 2020-02-17 LAB — CBC WITH DIFFERENTIAL/PLATELET
Abs Immature Granulocytes: 0.03 10*3/uL (ref 0.00–0.07)
Basophils Absolute: 0 10*3/uL (ref 0.0–0.1)
Basophils Relative: 0 %
Eosinophils Absolute: 0.1 10*3/uL (ref 0.0–0.5)
Eosinophils Relative: 1 %
HCT: 44 % (ref 39.0–52.0)
Hemoglobin: 13.9 g/dL (ref 13.0–17.0)
Immature Granulocytes: 0 %
Lymphocytes Relative: 28 %
Lymphs Abs: 2.5 10*3/uL (ref 0.7–4.0)
MCH: 27.1 pg (ref 26.0–34.0)
MCHC: 31.6 g/dL (ref 30.0–36.0)
MCV: 85.8 fL (ref 80.0–100.0)
Monocytes Absolute: 0.6 10*3/uL (ref 0.1–1.0)
Monocytes Relative: 7 %
Neutro Abs: 5.6 10*3/uL (ref 1.7–7.7)
Neutrophils Relative %: 64 %
Platelets: 324 10*3/uL (ref 150–400)
RBC: 5.13 MIL/uL (ref 4.22–5.81)
RDW: 14.8 % (ref 11.5–15.5)
WBC: 8.8 10*3/uL (ref 4.0–10.5)
nRBC: 0 % (ref 0.0–0.2)

## 2020-02-17 LAB — COMPREHENSIVE METABOLIC PANEL WITH GFR
ALT: 26 U/L (ref 0–44)
AST: 29 U/L (ref 15–41)
Albumin: 4 g/dL (ref 3.5–5.0)
Alkaline Phosphatase: 58 U/L (ref 38–126)
Anion gap: 11 (ref 5–15)
BUN: 15 mg/dL (ref 6–20)
CO2: 26 mmol/L (ref 22–32)
Calcium: 9.3 mg/dL (ref 8.9–10.3)
Chloride: 103 mmol/L (ref 98–111)
Creatinine, Ser: 1.09 mg/dL (ref 0.61–1.24)
GFR calc Af Amer: >60 mL/min
GFR calc non Af Amer: >60 mL/min
Glucose, Bld: 92 mg/dL (ref 70–99)
Potassium: 4.6 mmol/L (ref 3.5–5.1)
Sodium: 140 mmol/L (ref 135–145)
Total Bilirubin: 0.5 mg/dL (ref 0.3–1.2)
Total Protein: 7.7 g/dL (ref 6.5–8.1)

## 2020-02-17 LAB — TYPE AND SCREEN
ABO/RH(D): AB POS
Antibody Screen: NEGATIVE

## 2020-02-17 LAB — RAPID URINE DRUG SCREEN, HOSP PERFORMED
Amphetamines: NOT DETECTED
Barbiturates: NOT DETECTED
Benzodiazepines: NOT DETECTED
Cocaine: NOT DETECTED
Opiates: POSITIVE — AB
Tetrahydrocannabinol: POSITIVE — AB

## 2020-02-17 MED ORDER — MORPHINE SULFATE (PF) 4 MG/ML IV SOLN
4.0000 mg | Freq: Once | INTRAVENOUS | Status: AC
  Administered 2020-02-17: 4 mg via INTRAVENOUS
  Filled 2020-02-17: qty 1

## 2020-02-17 MED ORDER — SODIUM CHLORIDE 0.9 % IV BOLUS
1000.0000 mL | Freq: Once | INTRAVENOUS | Status: AC
  Administered 2020-02-17: 1000 mL via INTRAVENOUS

## 2020-02-17 MED ORDER — IOHEXOL 300 MG/ML  SOLN
100.0000 mL | Freq: Once | INTRAMUSCULAR | Status: AC | PRN
  Administered 2020-02-17: 100 mL via INTRAVENOUS

## 2020-02-17 MED ORDER — SODIUM CHLORIDE 0.9 % IV BOLUS: 1000.0000 mL | Freq: Once | INTRAVENOUS | Status: DC

## 2020-02-17 MED ORDER — KETOROLAC TROMETHAMINE 30 MG/ML IJ SOLN
15.0000 mg | Freq: Once | INTRAMUSCULAR | Status: AC
  Administered 2020-02-17: 15 mg via INTRAVENOUS
  Filled 2020-02-17: qty 1

## 2020-02-17 NOTE — ED Notes (Addendum)
RN offered pt pain medication. Pt doesn't want morphine at this time.

## 2020-02-17 NOTE — Discharge Instructions (Addendum)
As discussed, your evaluation today has been largely reassuring.  But, it is important that you monitor your condition carefully, and do not hesitate to return to the ED if you develop new, or concerning changes in your condition. ? ?Otherwise, please follow-up with your physician for appropriate ongoing care. ? ?
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
## 2020-02-17 NOTE — ED Notes (Signed)
Taken to CT.
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
## 2020-02-17 NOTE — ED Provider Notes (Signed)
12:00 PM Care of the patient assumed at signout.  Patient was awaiting urinalysis results, x-rays as well. I have previously discussed x-ray findings with patient which were reassuring. He has been ambulatory in the interval, but now, after providing his urinalysis, he requests discharge. Given his reassuring CT scan, x-ray, absence of hemodynamic stability after a period of monitoring, this is reasonable. Low suspicion for occult renal injury, but urinalysis was indicated for this consideration. Patient discharged, per request.   Gerhard Munch, MD 02/17/20 1200

## 2020-02-17 NOTE — ED Notes (Addendum)
Pt transported to CT ?

## 2021-08-12 IMAGING — DX DG FINGER MIDDLE 2+V*R*
3 series · 3 of 3 positions shown · non-contrast
Comparison: March 24, 2019.

CLINICAL DATA: Right middle finger swelling without known injury.

EXAM:
RIGHT MIDDLE FINGER 2+V

[finger ap]
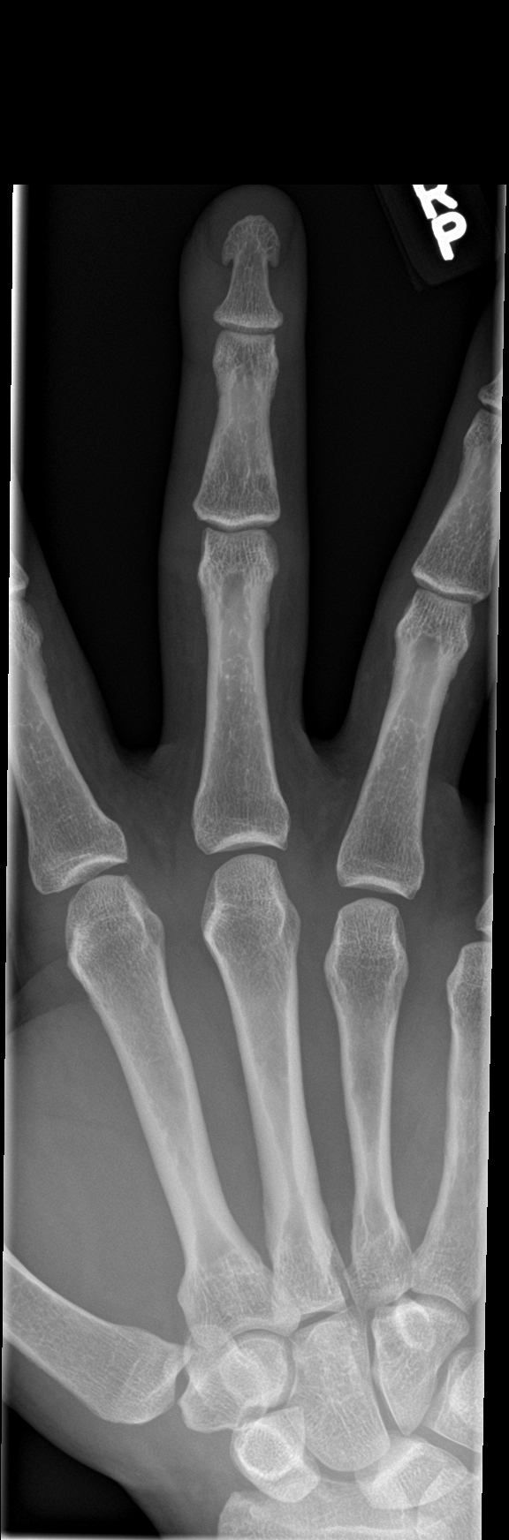

[finger obl]
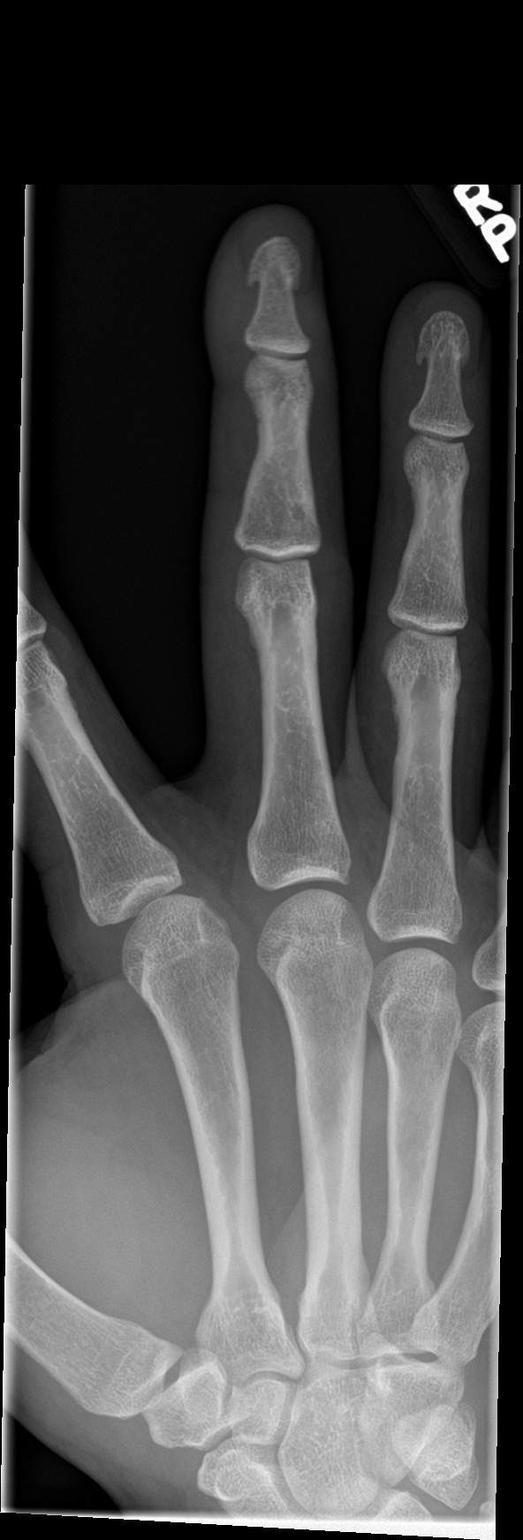

[finger lat]
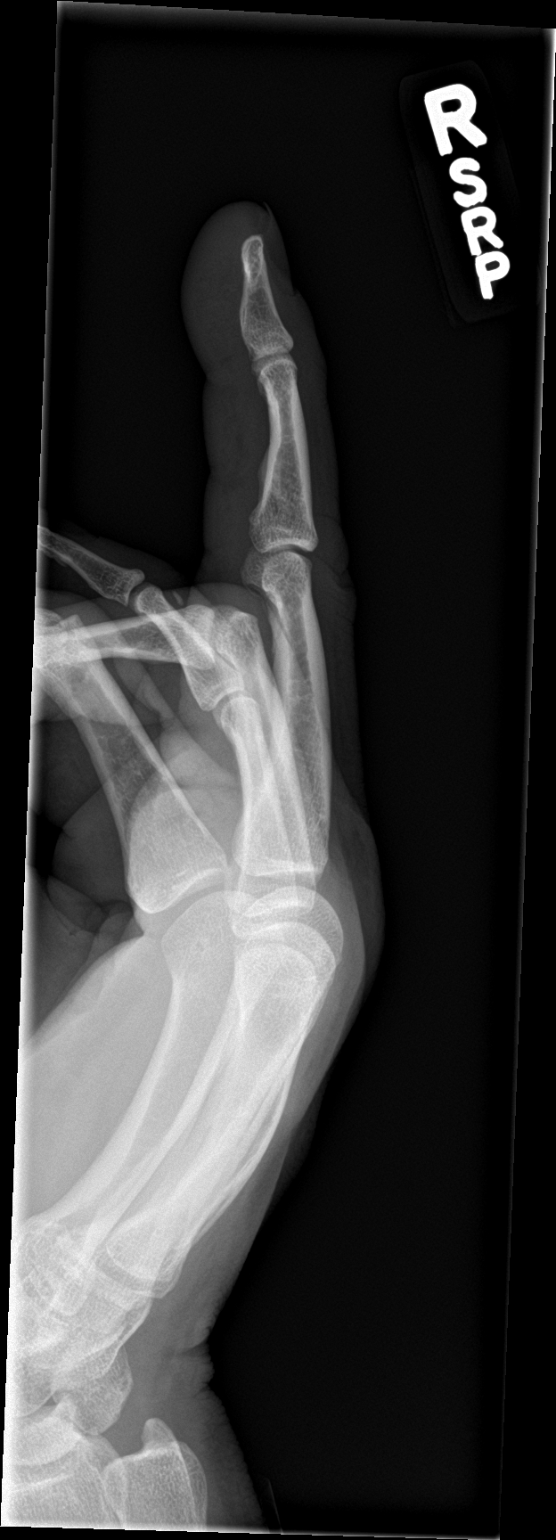

[3 of 3 positions shown; findings below may reference images not displayed]

FINDINGS: There is no evidence of fracture or dislocation. There is no
evidence of arthropathy or other focal bone abnormality. Soft
tissues are unremarkable. No lytic destruction is seen to suggest
osteomyelitis.
IMPRESSION: Negative.
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
## 2023-10-15 ENCOUNTER — Encounter (HOSPITAL_BASED_OUTPATIENT_CLINIC_OR_DEPARTMENT_OTHER): Payer: Self-pay | Admitting: Emergency Medicine

## 2023-10-15 ENCOUNTER — Other Ambulatory Visit (HOSPITAL_BASED_OUTPATIENT_CLINIC_OR_DEPARTMENT_OTHER): Payer: Self-pay

## 2023-10-15 ENCOUNTER — Other Ambulatory Visit: Payer: Self-pay

## 2023-10-15 ENCOUNTER — Emergency Department (HOSPITAL_BASED_OUTPATIENT_CLINIC_OR_DEPARTMENT_OTHER)
Admission: EM | Admit: 2023-10-15 | Discharge: 2023-10-15 | Disposition: A | Discharge status: Home / Self Care | Payer: Self-pay | Attending: Emergency Medicine | Admitting: Emergency Medicine

## 2023-10-15 DIAGNOSIS — K0889 Other specified disorders of teeth and supporting structures: Secondary | ICD-10-CM | POA: Insufficient documentation

## 2023-10-15 MED ORDER — ACETAMINOPHEN 500 MG PO TABS
1000.0000 mg | ORAL_TABLET | Freq: Once | ORAL | Status: AC
  Administered 2023-10-15: 1000 mg via ORAL
  Filled 2023-10-15: qty 2

## 2023-10-15 MED ORDER — CLINDAMYCIN HCL 150 MG PO CAPS
300.0000 mg | ORAL_CAPSULE | Freq: Four times a day (QID) | ORAL | 0 refills | Status: DC
  Filled 2023-10-15: qty 56, 7d supply, fill #0

## 2023-10-15 NOTE — Discharge Instructions (Signed)
 Please read and follow all provided instructions.  Your diagnoses today include:  1. Pain, dental     The exam and treatment you received today has been provided on an emergency basis only. This is not a substitute for complete medical or dental care.  Tests performed today include: Vital signs. See below for your results today.   Medications prescribed:  Clindamycin - antibiotic  You have been prescribed an antibiotic medicine: take the entire course of medicine even if you are feeling better. Stopping early can cause the antibiotic not to work.  Take any prescribed medications only as directed.  Home care instructions:  Follow any educational materials contained in this packet.  Follow-up instructions: Please follow-up with your dentist for further evaluation of your symptoms.   Dental Assistance: See attached dental referral and/or resource guide.   Return instructions:  Please return to the Emergency Department if you experience worsening symptoms. Please return if you develop a fever, you develop more swelling in your face or neck, you have trouble breathing or swallowing food. Please return if you have any other emergent concerns.  Additional Information:  Your vital signs today were: BP (!) 135/90 (BP Location: Left Arm)   Pulse 87   Temp 98.1 F (36.7 C) (Oral)   Resp 18   Ht 5\' 9"  (1.753 m)   Wt 117.9 kg   SpO2 98%   BMI 38.40 kg/m  If your blood pressure (BP) was elevated above 135/85 this visit, please have this repeated by your doctor within one month. --------------

## 2023-10-15 NOTE — ED Triage Notes (Signed)
 C/o R side dental pain. Face and jaw appears swelling. Denies fever. OTC meds not providing relief.

## 2023-10-15 NOTE — ED Notes (Signed)
 Discharge instructions, follow up care, and prescription reviewed and explained, pt verbalized understanding and had no further questions on d/c. Pt caox4, ambulatory, NAD on d/c.

## 2023-10-15 NOTE — ED Provider Notes (Signed)
 [facility] EMERGENCY DEPARTMENT AT [facility] Provider Note   CSN: [id number] Arrival date & time: 10/15/23  0849     History  Chief Complaint  Patient presents with   Dental Pain    Ronald Hill is a 43 y.o. male.  Patient presents to the emergency department for evaluation of right lower dental pain and mouth pain over the past 3 days.  Patient has noted swelling and pain behind his last molar, tooth #2, on the right lower jaw.  He has been taking ibuprofen 200 mg tablets.  He has been doing warm salt water rinses.  No difficulty breathing or swallowing.  No fevers.  No recent dental work.       Home Medications Prior to Admission medications   Medication Sig Start Date End Date Taking? Authorizing Provider  clindamycin (CLEOCIN) 150 MG capsule Take 2 capsules (300 mg total) by mouth every 6 (six) hours. 10/15/23  Yes Renne Crigler, PA-C  DULoxetine (CYMBALTA) 30 MG capsule Take 1 capsule (30 mg total) by mouth daily for 30 days. 11/26/18 12/26/18  Georgina Quint, MD      Allergies    Shellfish allergy, Amoxicillin, Penicillins, and Drug class [penicillins]    Review of Systems   Review of Systems  Physical Exam Updated Vital Signs BP (!) 135/90 (BP Location: Left Arm)   Pulse 87   Temp 98.1 F (36.7 C) (Oral)   Resp 18   Ht 5\' 9"  (1.753 m)   Wt 117.9 kg   SpO2 98%   BMI 38.40 kg/m  Physical Exam Vitals and nursing note reviewed.  Constitutional:      Appearance: He is well-developed.  HENT:     Head: Normocephalic and atraumatic.     Jaw: No trismus.     Right Ear: Tympanic membrane, ear canal and external ear normal.     Left Ear: Tympanic membrane, ear canal and external ear normal.     Nose: Nose normal.     Mouth/Throat:     Dentition: Normal dentition. No dental caries or dental abscesses.     Pharynx: Uvula midline. No uvula swelling.     Tonsils: No tonsillar abscesses.      Comments: Swelling posterior to tooth #2.  No true  pericoronitis, but there is soft tissue swelling and erythema when compared to the left side.  I could not palpate any fluctuance or induration.  No active drainage.  Tender to palpation in that area. Eyes:     Conjunctiva/sclera: Conjunctivae normal.     Pupils: Pupils are equal, round, and reactive to light.  Neck:     Comments: No neck swelling or Lugwig's angina Pulmonary:     Effort: No respiratory distress.  Musculoskeletal:     Cervical back: Normal range of motion and neck supple.  Skin:    General: Skin is warm and dry.  Neurological:     Mental Status: He is alert.  Psychiatric:        Mood and Affect: Mood normal.     ED Results / Procedures / Treatments   Labs (all labs ordered are listed, but only abnormal results are displayed) Labs Reviewed - No data to display  EKG None  Radiology No results found.  Procedures Procedures    Medications Ordered in ED Medications - No data to display  ED Course/ Medical Decision Making/ A&P  Patient seen and examined. History obtained directly from patient.   Labs/EKG: None ordered.  Imaging: None ordered.  Medications/Fluids: None ordered  Most recent vital signs reviewed and are as follows: BP (!) 135/90 (BP Location: Left Arm)   Pulse 87   Temp 98.1 F (36.7 C) (Oral)   Resp 18   Ht 5\' 9"  (1.753 m)   Wt 117.9 kg   SpO2 98%   BMI 38.40 kg/m   Initial impression: dental pain/dental infection  Plan: Discharge to home.   Prescriptions written for: Clindamycin, pen allergic  Other home care instructions discussed: Avoidance of chewing or other activities that makes the symptoms worse. Eat soft foods if needed and maintain good hydration.   ED return instructions discussed: Encouraged patient to return with worsening facial or neck swelling, difficulty breathing or swallowing, fever.   Follow-up instructions discussed: Patient encouraged to follow-up with provided dental referral, resources -- or their  own dentist if able.                                 Medical Decision Making Risk Prescription drug management.   Patient presents for dental pain. They do not have a fever and do not appear septic. Exam unconcerning for Ludwig's angina or other deep tissue infection in neck and I do not feel that advanced imaging is indicated at this time. Low suspicion for PTA, RPA, epiglottis based on exam.   Patient will be treated for dental infection with antibiotic. Encouraged tylenol/NSAIDs as prescribed or as directed on the packaging for pain. Encouraged follow-up with a dentist for definitive and long-term management.          Final Clinical Impression(s) / ED Diagnoses Final diagnoses:  Pain, dental    Rx / DC Orders ED Discharge Orders          Ordered    clindamycin (CLEOCIN) 150 MG capsule  Every 6 hours        10/15/23 0915              Renne Crigler, PA-C 10/15/23 7829    Lonell Grandchild, MD 10/16/23 1547

## 2023-12-04 HISTORY — PX: HAND TENDON SURGERY: SHX663

## 2024-02-15 ENCOUNTER — Encounter (HOSPITAL_BASED_OUTPATIENT_CLINIC_OR_DEPARTMENT_OTHER): Payer: Self-pay | Admitting: Orthopedic Surgery

## 2024-02-15 ENCOUNTER — Other Ambulatory Visit: Payer: Self-pay | Admitting: Orthopedic Surgery

## 2024-02-15 ENCOUNTER — Other Ambulatory Visit: Payer: Self-pay
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
## 2024-02-18 ENCOUNTER — Ambulatory Visit (HOSPITAL_BASED_OUTPATIENT_CLINIC_OR_DEPARTMENT_OTHER): Admitting: Anesthesiology

## 2024-02-18 ENCOUNTER — Other Ambulatory Visit: Payer: Self-pay

## 2024-02-18 ENCOUNTER — Encounter (HOSPITAL_BASED_OUTPATIENT_CLINIC_OR_DEPARTMENT_OTHER)
Admission: RE | Disposition: A | Discharge status: Home / Self Care | Payer: Self-pay | Source: Home / Self Care | Attending: Orthopedic Surgery

## 2024-02-18 ENCOUNTER — Ambulatory Visit (HOSPITAL_BASED_OUTPATIENT_CLINIC_OR_DEPARTMENT_OTHER)
Admission: RE | Admit: 2024-02-18 | Discharge: 2024-02-18 | Disposition: A | Discharge status: Home / Self Care | Attending: Orthopedic Surgery | Admitting: Orthopedic Surgery

## 2024-02-18 ENCOUNTER — Encounter (HOSPITAL_BASED_OUTPATIENT_CLINIC_OR_DEPARTMENT_OTHER): Payer: Self-pay | Admitting: Orthopedic Surgery

## 2024-02-18 DIAGNOSIS — S60851A Superficial foreign body of right wrist, initial encounter: Secondary | ICD-10-CM | POA: Diagnosis not present

## 2024-02-18 DIAGNOSIS — Z6833 Body mass index (BMI) 33.0-33.9, adult: Secondary | ICD-10-CM | POA: Diagnosis not present

## 2024-02-18 DIAGNOSIS — Z4802 Encounter for removal of sutures: Secondary | ICD-10-CM | POA: Insufficient documentation

## 2024-02-18 DIAGNOSIS — E669 Obesity, unspecified: Secondary | ICD-10-CM | POA: Diagnosis not present

## 2024-02-18 DIAGNOSIS — F17210 Nicotine dependence, cigarettes, uncomplicated: Secondary | ICD-10-CM | POA: Insufficient documentation

## 2024-02-18 DIAGNOSIS — L905 Scar conditions and fibrosis of skin: Secondary | ICD-10-CM | POA: Insufficient documentation

## 2024-02-18 HISTORY — PX: FOREIGN BODY REMOVAL: SHX962

## 2024-02-18 HISTORY — DX: Strain of flexor muscle, fascia and tendon of finger of unspecified finger at forearm level, initial encounter: S56.119A

## 2024-02-18 SURGERY — REMOVAL, FOREIGN BODY
Anesthesia: General | Site: Hand | Laterality: Right

## 2024-02-18 MED ORDER — HYDROCODONE-ACETAMINOPHEN 5-325 MG PO TABS: 1.0000 | ORAL_TABLET | Freq: Four times a day (QID) | ORAL | 0 refills | Status: AC | PRN

## 2024-02-18 MED ORDER — MIDAZOLAM HCL 2 MG/2ML IJ SOLN
INTRAMUSCULAR | Status: DC | PRN
  Administered 2024-02-18 (×2): 2 mg via INTRAVENOUS

## 2024-02-18 MED ORDER — FENTANYL CITRATE (PF) 100 MCG/2ML IJ SOLN: 25.0000 ug | INTRAMUSCULAR | Status: DC | PRN

## 2024-02-18 MED ORDER — FENTANYL CITRATE (PF) 100 MCG/2ML IJ SOLN
INTRAMUSCULAR | Status: AC
  Filled 2024-02-18: qty 2

## 2024-02-18 MED ORDER — DROPERIDOL 2.5 MG/ML IJ SOLN: 0.6250 mg | Freq: Once | INTRAMUSCULAR | Status: DC | PRN

## 2024-02-18 MED ORDER — DEXMEDETOMIDINE HCL IN NACL 80 MCG/20ML IV SOLN
INTRAVENOUS | Status: DC | PRN
  Administered 2024-02-18 (×2): 4 ug via INTRAVENOUS

## 2024-02-18 MED ORDER — FENTANYL CITRATE (PF) 100 MCG/2ML IJ SOLN
INTRAMUSCULAR | Status: DC | PRN
  Administered 2024-02-18 (×4): 25 ug via INTRAVENOUS
  Administered 2024-02-18 (×2): 50 ug via INTRAVENOUS

## 2024-02-18 MED ORDER — ONDANSETRON HCL 4 MG/2ML IJ SOLN
INTRAMUSCULAR | Status: AC
  Filled 2024-02-18: qty 2

## 2024-02-18 MED ORDER — OXYCODONE HCL 5 MG/5ML PO SOLN: 5.0000 mg | Freq: Once | ORAL | Status: DC | PRN

## 2024-02-18 MED ORDER — BUPIVACAINE HCL (PF) 0.25 % IJ SOLN
INTRAMUSCULAR | Status: DC | PRN
  Administered 2024-02-18 (×2): 9 mL

## 2024-02-18 MED ORDER — ACETAMINOPHEN 10 MG/ML IV SOLN: 1000.0000 mg | Freq: Once | INTRAVENOUS | Status: DC | PRN

## 2024-02-18 MED ORDER — 0.9 % SODIUM CHLORIDE (POUR BTL) OPTIME
TOPICAL | Status: DC | PRN
  Administered 2024-02-18 (×2): 100 mL

## 2024-02-18 MED ORDER — OXYCODONE HCL 5 MG PO TABS: 5.0000 mg | ORAL_TABLET | Freq: Once | ORAL | Status: DC | PRN

## 2024-02-18 MED ORDER — KETOROLAC TROMETHAMINE 30 MG/ML IJ SOLN
INTRAMUSCULAR | Status: DC | PRN
  Administered 2024-02-18 (×2): 30 mg via INTRAVENOUS

## 2024-02-18 MED ORDER — PROPOFOL 10 MG/ML IV BOLUS
INTRAVENOUS | Status: DC | PRN
  Administered 2024-02-18 (×2): 200 mg via INTRAVENOUS

## 2024-02-18 MED ORDER — MIDAZOLAM HCL 2 MG/2ML IJ SOLN
INTRAMUSCULAR | Status: AC
  Filled 2024-02-18: qty 2

## 2024-02-18 MED ORDER — LIDOCAINE 2% (20 MG/ML) 5 ML SYRINGE
INTRAMUSCULAR | Status: AC
  Filled 2024-02-18: qty 5

## 2024-02-18 MED ORDER — DEXAMETHASONE SODIUM PHOSPHATE 4 MG/ML IJ SOLN
INTRAMUSCULAR | Status: DC | PRN
  Administered 2024-02-18 (×2): 8 mg via INTRAVENOUS

## 2024-02-18 MED ORDER — LIDOCAINE HCL (CARDIAC) PF 100 MG/5ML IV SOSY
PREFILLED_SYRINGE | INTRAVENOUS | Status: DC | PRN
Start: 2024-02-18 — End: 2024-02-18
  Administered 2024-02-18 (×2): 100 mg via INTRAVENOUS

## 2024-02-18 MED ORDER — ONDANSETRON HCL 4 MG/2ML IJ SOLN
INTRAMUSCULAR | Status: DC | PRN
  Administered 2024-02-18 (×2): 4 mg via INTRAVENOUS

## 2024-02-18 MED ORDER — LACTATED RINGERS IV SOLN: INTRAVENOUS | Status: DC

## 2024-02-18 SURGICAL SUPPLY — 37 items
BLADE MINI RND TIP GREEN BEAV (BLADE) IMPLANT
BLADE SURG 15 STRL LF DISP TIS (BLADE) ×2 IMPLANT
BNDG COHESIVE 2X5 TAN ST LF (GAUZE/BANDAGES/DRESSINGS) IMPLANT
BNDG COMPR ESMARK 4X3 LF (GAUZE/BANDAGES/DRESSINGS) IMPLANT
BNDG ELASTIC 2INX 5YD STR LF (GAUZE/BANDAGES/DRESSINGS) IMPLANT
BNDG ELASTIC 3INX 5YD STR LF (GAUZE/BANDAGES/DRESSINGS) IMPLANT
BNDG GAUZE DERMACEA FLUFF 4 (GAUZE/BANDAGES/DRESSINGS) IMPLANT
CHLORAPREP W/TINT 26 (MISCELLANEOUS) ×1 IMPLANT
CORD BIPOLAR FORCEPS 12FT (ELECTRODE) IMPLANT
COVER BACK TABLE 60X90IN (DRAPES) ×1 IMPLANT
COVER MAYO STAND STRL (DRAPES) ×1 IMPLANT
CUFF TOURN SGL QUICK 18X4 (TOURNIQUET CUFF) ×1 IMPLANT
DRAPE EXTREMITY T 121X128X90 (DISPOSABLE) ×1 IMPLANT
DRAPE SURG 17X23 STRL (DRAPES) ×1 IMPLANT
GAUZE SPONGE 4X4 12PLY STRL (GAUZE/BANDAGES/DRESSINGS) ×1 IMPLANT
GAUZE XEROFORM 1X8 LF (GAUZE/BANDAGES/DRESSINGS) ×1 IMPLANT
GLOVE BIO SURGEON STRL SZ7.5 (GLOVE) ×1 IMPLANT
GLOVE BIOGEL PI IND STRL 6.5 (GLOVE) IMPLANT
GLOVE BIOGEL PI IND STRL 7.0 (GLOVE) IMPLANT
GOWN STRL REUS W/ TWL LRG LVL3 (GOWN DISPOSABLE) ×1 IMPLANT
GOWN STRL REUS W/TWL XL LVL3 (GOWN DISPOSABLE) ×1 IMPLANT
NDL HYPO 25X1 1.5 SAFETY (NEEDLE) IMPLANT
NDL SAFETY ECLIPSE 18X1.5 (NEEDLE) IMPLANT
NEEDLE HYPO 25X1 1.5 SAFETY (NEEDLE) ×1 IMPLANT
NS IRRIG 1000ML POUR BTL (IV SOLUTION) ×1 IMPLANT
PACK BASIN DAY SURGERY FS (CUSTOM PROCEDURE TRAY) ×1 IMPLANT
PAD CAST 3X4 CTTN HI CHSV (CAST SUPPLIES) IMPLANT
PADDING CAST ABS COTTON 4X4 ST (CAST SUPPLIES) ×1 IMPLANT
STOCKINETTE 4X48 STRL (DRAPES) ×1 IMPLANT
SUT ETHILON 3 0 PS 1 (SUTURE) IMPLANT
SUT ETHILON 4 0 PS 2 18 (SUTURE) IMPLANT
SWAB COLLECTION DEVICE MRSA (MISCELLANEOUS) IMPLANT
SWAB CULTURE ESWAB REG 1ML (MISCELLANEOUS) IMPLANT
SYR BULB EAR ULCER 3OZ GRN STR (SYRINGE) ×1 IMPLANT
SYR CONTROL 10ML LL (SYRINGE) IMPLANT
TOWEL GREEN STERILE FF (TOWEL DISPOSABLE) ×2 IMPLANT
UNDERPAD 30X36 HEAVY ABSORB (UNDERPADS AND DIAPERS) ×1 IMPLANT

## 2024-02-18 NOTE — Discharge Instructions (Addendum)
 [facility] Instructions Hand Surgery  Wound Care: Keep your hand elevated above the level of your heart.  Do not allow it to dangle by your side.  Keep the dressing dry and do not remove it unless your doctor advises you to do so.  He will usually change it at the time of your post-op visit.  Moving your fingers is advised to stimulate circulation but will depend on the site of your surgery.  If you have a splint applied, your doctor will advise you regarding movement.  Activity: Do not drive or operate machinery today.  Rest today and then you may return to your normal activity and work as indicated by your physician.  Diet:  Drink liquids today or eat a light diet.  You may resume a regular diet tomorrow.    General expectations: Pain for two to three days. Fingers may become slightly swollen.  Call your doctor if any of the following occur: Severe pain not relieved by pain medication. Elevated temperature. Dressing soaked with blood. Inability to move fingers. White or bluish color to fingers.   May take next anti-inflammatory (advil jeronimo) after 8:45p.   Post Anesthesia Home Care Instructions  Activity: Get plenty of rest for the remainder of the day. A responsible individual must stay with you for 24 hours following the procedure.  For the next 24 hours, DO NOT: -Drive a car -Advertising copywriter -Drink alcoholic beverages -Take any medication unless instructed by your physician -Make any legal decisions or sign important papers.  Meals: Start with liquid foods such as gelatin or soup. Progress to regular foods as tolerated. Avoid greasy, spicy, heavy foods. If nausea and/or vomiting occur, drink only clear liquids until the nausea and/or vomiting subsides. Call your physician if vomiting continues.  Special Instructions/Symptoms: Your throat may feel dry or sore from the anesthesia or the breathing tube placed in your throat during surgery. If this causes discomfort,  gargle with warm salt water. The discomfort should disappear within 24 hours.  If you had a scopolamine patch placed behind your ear for the management of post- operative nausea and/or vomiting:  1. The medication in the patch is effective for 72 hours, after which it should be removed.  Wrap patch in a tissue and discard in the trash. Wash hands thoroughly with soap and water. 2. You may remove the patch earlier than 72 hours if you experience unpleasant side effects which may include dry mouth, dizziness or visual disturbances. 3. Avoid touching the patch. Wash your hands with soap and water after contact with the patch.

## 2024-02-18 NOTE — Anesthesia Preprocedure Evaluation (Addendum)
 Anesthesia Evaluation  Patient identified by MRN, date of birth, ID band Patient awake    Reviewed: Allergy & Precautions, NPO status , Patient's Chart, lab work & pertinent test results  History of Anesthesia Complications Negative for: history of anesthetic complications  Airway Mallampati: III  TM Distance: >3 FB Neck ROM: Full    Dental  (+) Dental Advisory Given   Pulmonary neg shortness of breath, neg sleep apnea, neg COPD, neg recent URI, Current Smoker and Patient abstained from smoking.   Pulmonary exam normal breath sounds clear to auscultation       Cardiovascular negative cardio ROS  Rhythm:Regular Rate:Normal     Neuro/Psych negative neurological ROS  negative psych ROS   GI/Hepatic negative GI ROS, Neg liver ROS,,,  Endo/Other  negative endocrine ROS    Renal/GU negative Renal ROS  negative genitourinary   Musculoskeletal Retained suture (tendon repair) right wrist   Abdominal  (+) + obese  Peds  Hematology negative hematology ROS (+)   Anesthesia Other Findings   Reproductive/Obstetrics                              Anesthesia Physical Anesthesia Plan  ASA: 2  Anesthesia Plan: General   Post-op Pain Management:    Induction: Intravenous  PONV Risk Score and Plan: 1 and Ondansetron , Dexamethasone  and Treatment may vary due to age or medical condition  Airway Management Planned: LMA  Additional Equipment:   Intra-op Plan:   Post-operative Plan: Extubation in OR  Informed Consent: I have reviewed the patients History and Physical, chart, labs and discussed the procedure including the risks, benefits and alternatives for the proposed anesthesia with the patient or authorized representative who has indicated his/her understanding and acceptance.     Dental advisory given  Plan Discussed with: CRNA and Anesthesiologist  Anesthesia Plan Comments: (Risks of  general anesthesia discussed including, but not limited to, sore throat, hoarse voice, chipped/damaged teeth, injury to vocal cords, nausea and vomiting, allergic reactions, lung infection, heart attack, stroke, and death. All questions answered. )        Anesthesia Quick Evaluation

## 2024-02-18 NOTE — Transfer of Care (Signed)
 Immediate Anesthesia Transfer of Care Note  Patient: Ronald Hill  Procedure(s) Performed: REMOVAL RETAINED FOREIGN BODY RIGHT WRIST AND HAND (Right: Hand)  Patient Location: PACU  Anesthesia Type:General  Level of Consciousness: awake, alert , oriented, and patient cooperative  Airway & Oxygen Therapy: Patient Spontanous Breathing and Patient connected to nasal cannula oxygen  Post-op Assessment: Report given to RN and Post -op Vital signs reviewed and stable  Post vital signs: Reviewed and stable  Last Vitals:  Vitals Value Taken Time  BP    Temp    Pulse 68 02/18/24 15:00  Resp    SpO2 98 % 02/18/24 15:00  Vitals shown include unfiled device data.  Last Pain:  Vitals:   02/18/24 1235  TempSrc: Tympanic  PainSc: 4       Patients Stated Pain Goal: 8 (02/18/24 1235)  Complications: No notable events documented.

## 2024-02-18 NOTE — Op Note (Signed)
 NAME: Nicolaus Andel MEDICAL RECORD NO: [id number] DATE OF BIRTH: 1981-05-05 FACILITY: [facility] LOCATION: [facility] PHYSICIAN: Arwen Haseley R. Rockey Guarino, MD   OPERATIVE REPORT   DATE OF PROCEDURE: 02/18/24    PREOPERATIVE DIAGNOSIS: Right wrist and hand retained suture   POSTOPERATIVE DIAGNOSIS: Right wrist retained suture, right hand painful scar   PROCEDURE: 1.  Right wrist removal of foreign body/suture 2.  Right hand excision of painful scar, 2 mm   SURGEON:  Franky Curia, M.D.   ASSISTANT: none   ANESTHESIA:  General   INTRAVENOUS FLUIDS:  Per anesthesia flow sheet.   ESTIMATED BLOOD LOSS:  Minimal.   COMPLICATIONS:  None.   SPECIMENS:  none   TOURNIQUET TIME:    Total Tourniquet Time Documented: Upper Arm (Right) - 18 minutes Total: Upper Arm (Right) - 18 minutes    DISPOSITION:  Stable to PACU.   INDICATIONS: 43 year old male approximately 2-1/43-month status post right small finger flexor tendon reconstruction.  He has retained suture in the wrist and feels like he has retained suture in the palm of the hand that is bothersome to him.  He wishes to have these removed in the operating room.  Risks, benefits and alternatives of surgery were discussed including the risks of blood loss, infection, damage to nerves, vessels, tendons, ligaments, bone for surgery, need for additional surgery, complications with wound healing, continued pain, stiffness, retained foreign body.  He voiced understanding of these risks and elected to proceed.  OPERATIVE COURSE:  After being identified preoperatively by myself,  the patient and I agreed on the procedure and site of the procedure.  The surgical site was marked.  Surgical consent had been signed. Preoperative IV antibiotic prophylaxis was given. He was transferred to the operating room and placed on the operating table in supine position with the Right upper extremity on an arm board.  General anesthesia was induced by the  anesthesiologist.  Right upper extremity was prepped and draped in normal sterile orthopedic fashion.  A surgical pause was performed between the surgeons, anesthesia, and operating room staff and all were in agreement as to the patient, procedure, and site of procedure.  Tourniquet at the proximal aspect of the extremity was inflated to 250 mmHg after exsanguination of the arm with an Esmarch bandage.  The areas of concern had been marked preoperatively with the patient.  Incision was made in the palm of the hand in the area of felt retained suture.  This was carried into subcutaneous tissues by spreading technique.  There was no visible retained suture or foreign body.  The skin was thickened and discolored in the area however.  Once the wound was explored and no foreign body was located the bothersome portion of skin was ellipsed out.  Incision was then made at the wrist in the area where there were 2 small wounds.  The suture was visible just underneath the skin and had started to erode through the skin proximally.  The suture was identified and removed.  The knot was still present.  The wound was explored and no retained suture was noted.  The 2 chronic portions of the wound were excised.  The wounds were copiously irrigated with sterile saline and closed with 4-0 nylon in a horizontal mattress fashion.  They were then injected with quarter percent plain Marcaine  to aid in postoperative analgesia.  They were dressed with sterile Xeroform 4 x 4's and wrapped with a Coban dressing lightly.  The tourniquet was deflated at  18 minutes.  Fingertips were pink with brisk capillary refill after deflation of tourniquet.  The operative  drapes were broken down.  The patient was awoken from anesthesia safely.  He was transferred back to the stretcher and taken to PACU in stable condition.  I will see him back in the office in 1 week for postoperative followup.  I will give him a prescription for Norco 5/325 1 tab PO q6  hours prn pain, dispense #15.   Navarro Nine, MD Electronically signed, 02/18/24

## 2024-02-18 NOTE — Anesthesia Postprocedure Evaluation (Signed)
 Anesthesia Post Note  Patient: [facility]  Procedure(s) Performed: REMOVAL RETAINED FOREIGN BODY RIGHT WRIST AND HAND (Right: Hand)     Patient location during evaluation: PACU Anesthesia Type: General Level of consciousness: awake and alert and oriented Pain management: pain level controlled Vital Signs Assessment: post-procedure vital signs reviewed and stable Respiratory status: spontaneous breathing, nonlabored ventilation and respiratory function stable Cardiovascular status: blood pressure returned to baseline and stable Postop Assessment: no apparent nausea or vomiting Anesthetic complications: no   No notable events documented.  Last Vitals:  Vitals:   02/18/24 1501 02/18/24 1515  BP: (!) 146/84 (!) 139/90  Pulse: 68 (!) 57  Resp: (!) 25 10  Temp: (!) 36.4 C   SpO2: 95% 96%    Last Pain:  Vitals:   02/18/24 1501  TempSrc:   PainSc: 0-No pain                 Mizuki Hoel A.

## 2024-02-18 NOTE — H&P (Signed)
 Ronald Hill is an 43 y.o. male.   Chief Complaint: retained suture HPI: 43 yo male with retained suture in hand and wrist from surgery 2.5 months ago.  It is bothersome to him.  He continues to have draining wound at wrist.  He wishes to have surgical removal of retained suture.  Also feels retained suture in hand he would like to have removed also.  Allergies:  Allergies  Allergen Reactions   Shellfish Allergy Anaphylaxis, Hives, Itching and Other (See Comments)    Stomach cramps   Amoxicillin Other (See Comments)    With same symptoms as PCN   Penicillins Other (See Comments)    Per patient when showered felt like body was burning and navel had pus coming out of it    Past Medical History:  Diagnosis Date   Rupture of flexor tendon of finger     Past Surgical History:  Procedure Laterality Date   HAND TENDON SURGERY Right 12/04/2023   KNEE ARTHROSCOPY      Family History: History reviewed. No pertinent family history.  Social History:   reports that he has been smoking cigarettes. He has never used smokeless tobacco. He reports current alcohol use. He reports current drug use. Drug: Marijuana.  Medications: No medications prior to admission.    No results found for this or any previous visit (from the past 48 hours).  No results found.    Blood pressure 136/78, pulse 71, temperature 97.7 F (36.5 C), temperature source Tympanic, resp. rate 16, height 5' 10 (1.778 m), weight 104.5 kg, SpO2 100%.  General appearance: alert, cooperative, and appears stated age Head: Normocephalic, without obvious abnormality, atraumatic Neck: supple, symmetrical, trachea midline Extremities: Intact sensation and capillary refill all digits.  +epl/fpl/io.  Small wound at wrist without erythema or drainage. Skin: Skin color, texture, turgor normal. No rashes or lesions Neurologic: Grossly normal Incision/Wound: as above  Assessment/Plan Retained suture right hand and wrist.  Non  operative and operative treatment options have been discussed with the patient and patient wishes to proceed with operative treatment. Risks, benefits, and alternatives of surgery have been discussed and the patient agrees with the plan of care.   Ronald Hill 02/18/2024, 1:07 PM

## 2024-02-19 ENCOUNTER — Encounter (HOSPITAL_BASED_OUTPATIENT_CLINIC_OR_DEPARTMENT_OTHER): Payer: Self-pay | Admitting: Orthopedic Surgery

## 2024-05-18 ENCOUNTER — Other Ambulatory Visit: Payer: Self-pay

## 2024-05-18 ENCOUNTER — Encounter (HOSPITAL_COMMUNITY): Payer: Self-pay

## 2024-05-18 ENCOUNTER — Emergency Department (HOSPITAL_COMMUNITY)
Admission: EM | Admit: 2024-05-18 | Discharge: 2024-05-18 | Disposition: A | Discharge status: Home / Self Care | Attending: Emergency Medicine | Admitting: Emergency Medicine

## 2024-05-18 ENCOUNTER — Emergency Department (HOSPITAL_COMMUNITY)

## 2024-05-18 DIAGNOSIS — F17210 Nicotine dependence, cigarettes, uncomplicated: Secondary | ICD-10-CM | POA: Diagnosis not present

## 2024-05-18 DIAGNOSIS — R079 Chest pain, unspecified: Secondary | ICD-10-CM | POA: Diagnosis present

## 2024-05-18 DIAGNOSIS — R0789 Other chest pain: Secondary | ICD-10-CM | POA: Diagnosis not present

## 2024-05-18 LAB — CBC
HCT: 45 % (ref 39.0–52.0)
Hemoglobin: 14.5 g/dL (ref 13.0–17.0)
MCH: 27.3 pg (ref 26.0–34.0)
MCHC: 32.2 g/dL (ref 30.0–36.0)
MCV: 84.7 fL (ref 80.0–100.0)
Platelets: 392 K/uL (ref 150–400)
RBC: 5.31 MIL/uL (ref 4.22–5.81)
RDW: 14.3 % (ref 11.5–15.5)
WBC: 7.8 K/uL (ref 4.0–10.5)
nRBC: 0 % (ref 0.0–0.2)

## 2024-05-18 LAB — TROPONIN I (HIGH SENSITIVITY)
Troponin I (High Sensitivity): 5 ng/L (ref <18)
Troponin I (High Sensitivity): 5 ng/L (ref <18)

## 2024-05-18 LAB — BASIC METABOLIC PANEL WITH GFR
Anion gap: 11 (ref 5–15)
BUN: 17 mg/dL (ref 6–20)
CO2: 20 mmol/L — ABNORMAL LOW (ref 22–32)
Calcium: 9 mg/dL (ref 8.9–10.3)
Chloride: 107 mmol/L (ref 98–111)
Creatinine, Ser: 0.9 mg/dL (ref 0.61–1.24)
GFR, Estimated: >60 mL/min (ref >60)
Glucose, Bld: 137 mg/dL — ABNORMAL HIGH (ref 70–99)
Potassium: 4.4 mmol/L (ref 3.5–5.1)
Sodium: 138 mmol/L (ref 135–145)

## 2024-05-18 MED ORDER — KETOROLAC TROMETHAMINE 15 MG/ML IJ SOLN
15.0000 mg | Freq: Once | INTRAMUSCULAR | Status: AC
  Administered 2024-05-18: 15 mg via INTRAVENOUS
  Filled 2024-05-18: qty 1

## 2024-05-18 NOTE — ED Triage Notes (Addendum)
 PT BIB [facility] for c/o of CP from home. Pt has been more active and stressed at work recently. Left sided CP radiating to left arm with intermittent numbing started this AM while at work. Denies dizziness, NV, pain increased with inspiration but not SOB. Denies injury at work  120/82 HR 70 99% RA

## 2024-05-18 NOTE — Discharge Instructions (Addendum)
 We evaluated you for your chest pain.  Your testing in the emergency department was reassuring.  Your cardiac enzyme testing was negative.  We suspect your chest pain is most likely related to your chest wall, muscles and ribs in your chest.  We do not think it is heart related, but we would still like you to follow-up with a cardiologist and we have placed a referral for this.  Please take Tylenol  (acetaminophen ) and Motrin  (ibuprofen ) for your symptoms at home.  You can take 1000 mg of Tylenol  every 6 hours and 600 mg of Motrin  every 6 hours as needed for your symptoms.  You can take these medicines together as needed, either at the same time, or alternating every 3 hours.  Please return to the emergency department if you develop any new or worsening symptoms such as severe pain, lightheadedness or fainting, vomiting, difficulty breathing, or any other new symptoms.

## 2024-05-18 NOTE — ED Provider Notes (Signed)
 [place] EMERGENCY DEPARTMENT AT [facility] Provider Note  CSN: [id number] Arrival date & time: 05/18/24 1642  Chief Complaint(s) Chest Pain  HPI Ronald Hill is a 43 y.o. male without significant past medical history presenting to the emergency department with chest pain.  Patient reports chest pain starting this morning.  Reports started after getting an argument.  Reports it is the left side of the chest.  Is worse with moving the left arm, taking a deep breath, movement.  Denies any shortness of breath, nausea or vomiting, lightheadedness, fainting.  Reports that he went home after work, took a shower, after the shower felt sweaty and felt the pain was worse so called paramedics at this point.  Denies similar episode in the past.  Denies any recent travel or surgery.   Past Medical History Past Medical History:  Diagnosis Date   Rupture of flexor tendon of finger    There are no active problems to display for this patient.  Home Medication(s) Prior to Admission medications   Medication Sig Start Date End Date Taking? Authorizing Provider  HYDROcodone -acetaminophen  (NORCO/VICODIN) 5-325 MG tablet Take 1 tablet by mouth every 6 (six) hours as needed for moderate pain (pain score 4-6). 02/18/24   Murrell Drivers, MD                                                                                                                                    Past Surgical History Past Surgical History:  Procedure Laterality Date   FOREIGN BODY REMOVAL Right 02/18/2024   Procedure: REMOVAL RETAINED FOREIGN BODY RIGHT WRIST AND HAND;  Surgeon: Murrell Drivers, MD;  Location: [facility];  Service: Orthopedics;  Laterality: Right;  REMOVAL RETAINED FOREIGN BODY RIGHT WRIST   HAND TENDON SURGERY Right 12/04/2023   KNEE ARTHROSCOPY     Family History History reviewed. No pertinent family history.  Social History Social History   Tobacco Use   Smoking status: Every Day    Types:  Cigarettes   Smokeless tobacco: Never  Substance Use Topics   Alcohol use: Yes    Comment: BEER OR SHOT OF LIQUOR   Drug use: Yes    Types: Marijuana    Comment: last time 42 hours ago   Allergies Shellfish allergy, Amoxicillin, and Penicillins  Review of Systems Review of Systems  All other systems reviewed and are negative.   Physical Exam Vital Signs  I have reviewed the triage vital signs BP 125/83   Pulse 65   Temp 98.3 F (36.8 C) (Oral)   Resp (!) 21   Ht 5' 10 (1.778 m)   Wt 108.9 kg   SpO2 99%   BMI 34.44 kg/m  Physical Exam Vitals and nursing note reviewed.  Constitutional:      General: He is not in acute distress.    Appearance: Normal appearance.  HENT:     Mouth/Throat:  Mouth: Mucous membranes are moist.  Eyes:     Conjunctiva/sclera: Conjunctivae normal.  Cardiovascular:     Rate and Rhythm: Normal rate and regular rhythm.     Pulses:          Radial pulses are 2+ on the right side and 2+ on the left side.  Pulmonary:     Effort: Pulmonary effort is normal. No respiratory distress.     Breath sounds: Normal breath sounds.  Chest:     Chest wall: Tenderness (left upper chest reproduces pain) present.  Abdominal:     General: Abdomen is flat.     Palpations: Abdomen is soft.     Tenderness: There is no abdominal tenderness.  Musculoskeletal:     Right lower leg: No edema.     Left lower leg: No edema.  Skin:    General: Skin is warm and dry.     Capillary Refill: Capillary refill takes less than 2 seconds.  Neurological:     Mental Status: He is alert and oriented to person, place, and time. Mental status is at baseline.  Psychiatric:        Mood and Affect: Mood normal.        Behavior: Behavior normal.     ED Results and Treatments Labs (all labs ordered are listed, but only abnormal results are displayed) Labs Reviewed  BASIC METABOLIC PANEL WITH GFR - Abnormal; Notable for the following components:      Result Value    CO2 20 (*)    Glucose, Bld 137 (*)    All other components within normal limits  CBC  TROPONIN I (HIGH SENSITIVITY)  TROPONIN I (HIGH SENSITIVITY)                                                                                                                          Radiology DG Chest 2 View Result Date: 05/18/2024 EXAM: 2 VIEW(S) XRAY OF THE CHEST 05/18/2024 05:28:00 PM COMPARISON: None available. CLINICAL HISTORY: CP FINDINGS: LUNGS AND PLEURA: No focal pulmonary opacity. No pulmonary edema. No pleural effusion. No pneumothorax. HEART AND MEDIASTINUM: No acute abnormality of the cardiac and mediastinal silhouettes. BONES AND SOFT TISSUES: No acute osseous abnormality. IMPRESSION: 1. No acute cardiopulmonary process. Electronically signed by: Norman Gatlin MD 05/18/2024 05:49 PM EST RP Workstation: [id number]    Pertinent labs & imaging results that were available during my care of the patient were reviewed by me and considered in my medical decision making (see MDM for details).  Medications Ordered in ED Medications  ketorolac  (TORADOL ) 15 MG/ML injection 15 mg (15 mg Intravenous Given 05/18/24 1759)  Procedures Procedures  (including critical care time)  Medical Decision Making / ED Course   MDM:  43 year old presenting to the emergency department with chest pain.  Suspect likely musculoskeletal pain.  It is reproducible, worse with movement and movement of the arm.  Considered PE given pleuritic nature but patient is PERC negative and overall concern for this is extremely low.  Considered ACS, given episode of diaphoresis will check troponin x 2 but overall seems less likely.  EKG is reassuring.  Chest x-ray obtained without evidence of any pneumonia or pneumothorax.  Will reassess.  Clinical Course as of 05/18/24 2124  [place] May 18, 2024  2123  Workup reassuring.  Troponin negative x 2.  Feel patient is stable for discharge to home. Will discharge patient to home. All questions answered. Patient comfortable with plan of discharge. Return precautions discussed with patient and specified on the after visit summary.  [WS]    Clinical Course User Index [WS] Francesca Elsie CROME, MD     Additional history obtained: -Additional history obtained from ems -External records from outside source obtained and reviewed including: Chart review including previous notes, labs, imaging, consultation notes including prior notoes    Lab Tests: -I ordered, reviewed, and interpreted labs.   The pertinent results include:   Labs Reviewed  BASIC METABOLIC PANEL WITH GFR - Abnormal; Notable for the following components:      Result Value   CO2 20 (*)    Glucose, Bld 137 (*)    All other components within normal limits  CBC  TROPONIN I (HIGH SENSITIVITY)  TROPONIN I (HIGH SENSITIVITY)    Notable for mild low CO2  EKG   EKG Interpretation Date/Time:  Sunday May 18 2024 16:50:26 EST Ventricular Rate:  81 PR Interval:  158 QRS Duration:  109 QT Interval:  383 QTC Calculation: 445 R Axis:   109  Text Interpretation: Sinus rhythm Right axis deviation Confirmed by Francesca Elsie ([id number]) on 05/18/2024 5:13:23 PM         Imaging Studies ordered: I ordered imaging studies including CXR On my interpretation imaging demonstrates no acute process I independently visualized and interpreted imaging. I agree with the radiologist interpretation   Medicines ordered and prescription drug management: Meds ordered this encounter  Medications   ketorolac  (TORADOL ) 15 MG/ML injection 15 mg    -I have reviewed the patients home medicines and have made adjustments as needed   Social Determinants of Health:  Diagnosis or treatment significantly limited by social determinants of health: obesity   Reevaluation: After the interventions  noted above, I reevaluated the patient and found that their symptoms have improved  Co morbidities that complicate the patient evaluation  Past Medical History:  Diagnosis Date   Rupture of flexor tendon of finger       Dispostion: Disposition decision including need for hospitalization was considered, and patient discharged from emergency department.    Final Clinical Impression(s) / ED Diagnoses Final diagnoses:  Atypical chest pain     This chart was dictated using voice recognition software.  Despite best efforts to proofread,  errors can occur which can change the documentation meaning.    Francesca Elsie CROME, MD 05/18/24 2124
